# Patient Record
Sex: Male | Born: 1965 | Hispanic: No | Smoking: Current some day smoker
Health system: Southern US, Community
[De-identification: ages and names within clinical notes are randomized; demographics above are authoritative.]

## PROBLEM LIST (undated history)

## (undated) DIAGNOSIS — Z720 Tobacco use: Secondary | ICD-10-CM

## (undated) DIAGNOSIS — E785 Hyperlipidemia, unspecified: Secondary | ICD-10-CM

## (undated) DIAGNOSIS — T8859XA Other complications of anesthesia, initial encounter: Secondary | ICD-10-CM

## (undated) DIAGNOSIS — Z9289 Personal history of other medical treatment: Secondary | ICD-10-CM

## (undated) DIAGNOSIS — E559 Vitamin D deficiency, unspecified: Secondary | ICD-10-CM

## (undated) DIAGNOSIS — R079 Chest pain, unspecified: Secondary | ICD-10-CM

## (undated) DIAGNOSIS — J40 Bronchitis, not specified as acute or chronic: Secondary | ICD-10-CM

## (undated) DIAGNOSIS — R112 Nausea with vomiting, unspecified: Secondary | ICD-10-CM

## (undated) DIAGNOSIS — D860 Sarcoidosis of lung: Secondary | ICD-10-CM

## (undated) HISTORY — DX: Vitamin D deficiency, unspecified: E55.9

## (undated) HISTORY — PX: TONSILLECTOMY: SUR1361

## (undated) HISTORY — DX: Personal history of other medical treatment: Z92.89

## (undated) HISTORY — DX: Tobacco use: Z72.0

## (undated) HISTORY — DX: Hyperlipidemia, unspecified: E78.5

## (undated) HISTORY — DX: Chest pain, unspecified: R07.9

## (undated) HISTORY — DX: Nausea with vomiting, unspecified: R11.2

## (undated) HISTORY — DX: Other complications of anesthesia, initial encounter: T88.59XA

---

## 1996-02-15 DIAGNOSIS — D860 Sarcoidosis of lung: Secondary | ICD-10-CM

## 1996-02-15 HISTORY — DX: Sarcoidosis of lung: D86.0

## 1996-02-15 HISTORY — PX: LUNG BIOPSY: SHX232

## 2000-03-25 ENCOUNTER — Emergency Department (HOSPITAL_COMMUNITY): Admission: EM | Admit: 2000-03-25 | Discharge: 2000-03-25 | Payer: Self-pay | Admitting: Emergency Medicine

## 2001-06-06 ENCOUNTER — Encounter: Payer: Self-pay | Admitting: Emergency Medicine

## 2001-06-06 ENCOUNTER — Emergency Department (HOSPITAL_COMMUNITY): Admission: EM | Admit: 2001-06-06 | Discharge: 2001-06-06 | Payer: Self-pay | Admitting: Emergency Medicine

## 2002-04-25 ENCOUNTER — Emergency Department (HOSPITAL_COMMUNITY): Admission: EM | Admit: 2002-04-25 | Discharge: 2002-04-25 | Payer: Self-pay | Admitting: Emergency Medicine

## 2002-04-25 ENCOUNTER — Encounter: Payer: Self-pay | Admitting: Emergency Medicine

## 2005-01-25 ENCOUNTER — Emergency Department (HOSPITAL_COMMUNITY): Admission: EM | Admit: 2005-01-25 | Discharge: 2005-01-26 | Payer: Self-pay | Admitting: Emergency Medicine

## 2007-11-07 ENCOUNTER — Emergency Department (HOSPITAL_COMMUNITY): Admission: EM | Admit: 2007-11-07 | Discharge: 2007-11-07 | Payer: Self-pay | Admitting: *Deleted

## 2010-09-20 ENCOUNTER — Emergency Department (HOSPITAL_COMMUNITY): Payer: Worker's Compensation

## 2010-09-20 ENCOUNTER — Emergency Department (HOSPITAL_COMMUNITY)
Admission: EM | Admit: 2010-09-20 | Discharge: 2010-09-20 | Disposition: A | Discharge status: Home / Self Care | Payer: Worker's Compensation | Attending: Emergency Medicine | Admitting: Emergency Medicine

## 2010-09-20 DIAGNOSIS — S51809A Unspecified open wound of unspecified forearm, initial encounter: Secondary | ICD-10-CM | POA: Insufficient documentation

## 2010-09-20 DIAGNOSIS — D869 Sarcoidosis, unspecified: Secondary | ICD-10-CM | POA: Insufficient documentation

## 2010-09-20 DIAGNOSIS — M79609 Pain in unspecified limb: Secondary | ICD-10-CM | POA: Insufficient documentation

## 2010-09-20 DIAGNOSIS — W268XXA Contact with other sharp object(s), not elsewhere classified, initial encounter: Secondary | ICD-10-CM | POA: Insufficient documentation

## 2010-09-20 DIAGNOSIS — M7989 Other specified soft tissue disorders: Secondary | ICD-10-CM | POA: Insufficient documentation

## 2010-09-20 DIAGNOSIS — E7800 Pure hypercholesterolemia, unspecified: Secondary | ICD-10-CM | POA: Insufficient documentation

## 2010-11-15 LAB — RAPID STREP SCREEN (MED CTR MEBANE ONLY): Streptococcus, Group A Screen (Direct): NEGATIVE

## 2011-06-15 DIAGNOSIS — Z9289 Personal history of other medical treatment: Secondary | ICD-10-CM

## 2011-06-15 DIAGNOSIS — R079 Chest pain, unspecified: Secondary | ICD-10-CM

## 2011-06-15 HISTORY — DX: Chest pain, unspecified: R07.9

## 2011-06-15 HISTORY — DX: Personal history of other medical treatment: Z92.89

## 2011-07-03 ENCOUNTER — Emergency Department (HOSPITAL_COMMUNITY): Payer: BC Managed Care – PPO

## 2011-07-03 ENCOUNTER — Observation Stay (HOSPITAL_COMMUNITY)
Admission: EM | Admit: 2011-07-03 | Discharge: 2011-07-04 | Disposition: A | Discharge status: Home / Self Care | Payer: BC Managed Care – PPO | Attending: Emergency Medicine | Admitting: Emergency Medicine

## 2011-07-03 ENCOUNTER — Encounter (HOSPITAL_COMMUNITY): Payer: Self-pay | Admitting: Emergency Medicine

## 2011-07-03 DIAGNOSIS — R079 Chest pain, unspecified: Principal | ICD-10-CM | POA: Insufficient documentation

## 2011-07-03 DIAGNOSIS — R0602 Shortness of breath: Secondary | ICD-10-CM | POA: Insufficient documentation

## 2011-07-03 HISTORY — DX: Sarcoidosis of lung: D86.0

## 2011-07-03 HISTORY — DX: Bronchitis, not specified as acute or chronic: J40

## 2011-07-03 LAB — BASIC METABOLIC PANEL
CO2: 26 mEq/L (ref 19–32)
Calcium: 9.9 mg/dL (ref 8.4–10.5)
Chloride: 105 mEq/L (ref 96–112)
Glucose, Bld: 89 mg/dL (ref 70–99)
Potassium: 3.7 mEq/L (ref 3.5–5.1)
Sodium: 140 mEq/L (ref 135–145)

## 2011-07-03 LAB — TROPONIN I
Troponin I: <0.3 ng/mL (ref <0.30)
Troponin I: <0.3 ng/mL (ref <0.30)

## 2011-07-03 LAB — CBC
Hemoglobin: 16.4 g/dL (ref 13.0–17.0)
MCH: 30.4 pg (ref 26.0–34.0)
Platelets: 233 10*3/uL (ref 150–400)
RBC: 5.4 MIL/uL (ref 4.22–5.81)
WBC: 10.4 10*3/uL (ref 4.0–10.5)

## 2011-07-03 MED ORDER — ALBUTEROL SULFATE HFA 108 (90 BASE) MCG/ACT IN AERS
2.0000 | INHALATION_SPRAY | RESPIRATORY_TRACT | Status: DC
  Administered 2011-07-04: 2 via RESPIRATORY_TRACT
  Filled 2011-07-03: qty 6.7

## 2011-07-03 MED ORDER — ASPIRIN 325 MG PO TABS
325.0000 mg | ORAL_TABLET | Freq: Once | ORAL | Status: AC
  Administered 2011-07-03: 325 mg via ORAL
  Filled 2011-07-03: qty 1

## 2011-07-03 MED ORDER — GI COCKTAIL ~~LOC~~
30.0000 mL | Freq: Once | ORAL | Status: AC
  Administered 2011-07-03: 30 mL via ORAL
  Filled 2011-07-03: qty 30

## 2011-07-03 MED ORDER — NITROGLYCERIN 2 % TD OINT
1.0000 [in_us] | TOPICAL_OINTMENT | Freq: Once | TRANSDERMAL | Status: AC
  Administered 2011-07-03: 1 [in_us] via TOPICAL
  Filled 2011-07-03: qty 1

## 2011-07-03 NOTE — ED Notes (Signed)
Pt's wife left contact # [phone] Marilu Favre.  Plz call if pt is admitted. She had to leave to pick up kids

## 2011-07-03 NOTE — ED Notes (Signed)
Pt requesting albuterol inhaler for recent sinus congestion and tightness. Advised MD and received verbal order for inhaler.

## 2011-07-03 NOTE — ED Notes (Signed)
Reports pain in center of chest that radiated to R jaw last night and again around 12pm today.  Denies pain at present.  Reports mild lightheadedness.  Denies nausea, vomiting, and sob.

## 2011-07-03 NOTE — ED Provider Notes (Signed)
Subjective: complains of chest pain anterior radiating to right ear 2 episodes onset yesterday yesterday's episode lasted several hours today's episode lasted 20 minutes worse with walking improved with rest patient presently asymptomatic. Cardiac risk factors include smoker hypercholesterolemia  objective  on exam no distress lungs clear to auscultation heart regular rate and rhythm abdomen soft nontender extremities without edema skin warm dry Assessment angina remote possibility Plan CDU protocol chest pain-rule out  Doug Sou, MD 07/03/11 1608

## 2011-07-03 NOTE — ED Provider Notes (Signed)
History     CSN: [id number]  Arrival date & time 07/03/11  1459   First MD Initiated Contact with Patient 07/03/11 1531      Chief Complaint  Patient presents with  . Chest Pain    (Consider location/radiation/quality/duration/timing/severity/associated sxs/prior treatment) HPI Comments: Had chest pressure last night while sleeping that resolved without intervention.  4 hours ago had return of central chest pressure radiating to right jaw.  Pt says pain began at rest but worse with with both exertion and deep breaths.  Has not had any similar symptoms previously.  No hx of PE, DVT.  No leg swelling.  No recent immobility.  Patient is a 46 y.o. male presenting with chest pain. The history is provided by the patient.  Chest Pain The chest pain began 3 - 5 hours ago. Duration of episode(s) is 4 hours. Chest pain occurs intermittently. The chest pain is resolved. The pain is associated with breathing (worse with breathing and exertion). The severity of the pain is moderate. The quality of the pain is described as pressure-like. The pain radiates to the right jaw (central chest to right jaw). Chest pain is worsened by deep breathing and exertion. Primary symptoms include shortness of breath. Pertinent negatives for primary symptoms include no fever, no fatigue, no syncope, no cough, no wheezing, no palpitations, no abdominal pain, no nausea, no vomiting and no dizziness. He tried nothing for the symptoms.     Past Medical History  Diagnosis Date  . Pulmonary sarcoidosis   . Bronchitis     Past Surgical History  Procedure Date  . Tonsillectomy     No family history on file.  History  Substance Use Topics  . Smoking status: Current Everyday Smoker  . Smokeless tobacco: Not on file  . Alcohol Use: No      Review of Systems  Constitutional: Negative for fever, activity change and fatigue.  HENT: Negative for congestion.   Eyes: Negative for pain.  Respiratory: Positive for  shortness of breath. Negative for cough, chest tightness, wheezing and stridor.   Cardiovascular: Positive for chest pain. Negative for palpitations, leg swelling and syncope.  Gastrointestinal: Negative for nausea, vomiting and abdominal pain.  Genitourinary: Negative for dysuria.  Musculoskeletal: Negative for arthralgias.  Skin: Negative for rash.  Neurological: Negative for dizziness and headaches.  Psychiatric/Behavioral: Negative for behavioral problems.    Allergies  Tramadol  Home Medications   Current Outpatient Rx  Name Route Sig Dispense Refill  . BISMUTH SUBSALICYLATE 262 MG/15ML PO SUSP Oral Take 30 mLs by mouth every 6 (six) hours as needed. For indigestion    . CALCIUM CARBONATE ANTACID 500 MG PO CHEW Oral Chew 4 tablets by mouth daily as needed. For indigestion    . IBUPROFEN 200 MG PO TABS Oral Take 600 mg by mouth every 6 (six) hours as needed. For pain    . NAPROXEN SODIUM 220 MG PO TABS Oral Take 220 mg by mouth 2 (two) times daily with a meal.      BP 119/80  Pulse 51  Temp(Src) 98.6 F (37 C) (Oral)  Resp 18  SpO2 100%  Physical Exam  Constitutional: He is oriented to person, place, and time. He appears well-developed and well-nourished. No distress.  HENT:  Head: Normocephalic and atraumatic.  Eyes: Conjunctivae and EOM are normal. Pupils are equal, round, and reactive to light. No scleral icterus.  Neck: Normal range of motion. Neck supple.  Cardiovascular: Normal rate and regular rhythm.  Exam  reveals no gallop and no friction rub.   No murmur heard. Pulmonary/Chest: Effort normal and breath sounds normal. No respiratory distress. He has no wheezes. He has no rales. He exhibits no tenderness.  Abdominal: Soft. He exhibits no distension and no mass. There is no tenderness. There is no rebound and no guarding.  Musculoskeletal: Normal range of motion. He exhibits no edema and no tenderness.  Neurological: He is alert and oriented to person, place, and  time. He has normal reflexes. No cranial nerve deficit. He exhibits normal muscle tone. Coordination normal.  Skin: Skin is warm and dry. No rash noted. He is not diaphoretic. No erythema.  Psychiatric: He has a normal mood and affect. His behavior is normal. Judgment and thought content normal.    ED Course  Procedures (including critical care time)   Date: 07/03/2011  Rate: 59  Rhythm: normal sinus rhythm  QRS Axis: normal  Intervals: normal  ST/T Wave abnormalities: early repol c/w prior  Conduction Disutrbances: none  Narrative Interpretation:   Old EKG Reviewed: unchanged    Labs Reviewed  CBC - Abnormal; Notable for the following:    MCHC 36.4 (*)    All other components within normal limits  BASIC METABOLIC PANEL  TROPONIN I  TROPONIN I  TROPONIN I  TROPONIN I   Dg Chest Port 1 View  07/03/2011  *RADIOLOGY REPORT*  Clinical Data: Chest pain.  PORTABLE CHEST - 1 VIEW  Comparison: No priors.  Findings: Right costophrenic sulcus is excluded from the lower margin of the image.  Lung volumes are normal.  No consolidative airspace disease.  No definite pleural effusions.  No pneumothorax. No pulmonary nodule or mass noted.  Pulmonary vasculature and the cardiomediastinal silhouette are within normal limits.  IMPRESSION: 1. No radiographic evidence of acute cardiopulmonary disease.  Original Report Authenticated By: Florencia Reasons, M.D.     1. Chest pain       MDM  Had chest pressure last night while sleeping that resolved without intervention.  4 hours ago had return of central chest pressure radiating to right jaw.  Pt says pain began at rest but worse with with both exertion and deep breaths.  Has not had any similar symptoms previously.  No hx of PE, DVT.  No leg swelling.  No recent immobility.  Atypical presentation and PERC negative - doubt PE.  EKG, labs unconcerning.  Chest pain free throughout ED stay.  Some suspicion for ACS.  Will rule out and exercise stress  in low risk chest pain obs.  Pt comfortable with plan.        Army Chaco, MD 07/03/11 1810

## 2011-07-04 ENCOUNTER — Observation Stay (HOSPITAL_COMMUNITY)
Admit: 2011-07-04 | Discharge: 2011-07-04 | Disposition: A | Discharge status: Home / Self Care | Payer: BC Managed Care – PPO | Attending: Emergency Medicine | Admitting: Emergency Medicine

## 2011-07-04 MED ORDER — NITROGLYCERIN 0.4 MG SL SUBL
0.4000 mg | SUBLINGUAL_TABLET | Freq: Once | SUBLINGUAL | Status: AC
  Administered 2011-07-04: 0.4 mg via SUBLINGUAL

## 2011-07-04 MED ORDER — METOPROLOL TARTRATE 1 MG/ML IV SOLN
INTRAVENOUS | Status: AC
  Filled 2011-07-04: qty 15

## 2011-07-04 MED ORDER — IOHEXOL 350 MG/ML SOLN
80.0000 mL | Freq: Once | INTRAVENOUS | Status: AC | PRN
  Administered 2011-07-04: 80 mL via INTRAVENOUS

## 2011-07-04 MED ORDER — NITROGLYCERIN 0.4 MG SL SUBL
SUBLINGUAL_TABLET | SUBLINGUAL | Status: AC
  Filled 2011-07-04: qty 25

## 2011-07-04 MED ORDER — METOPROLOL TARTRATE 25 MG PO TABS
50.0000 mg | ORAL_TABLET | Freq: Once | ORAL | Status: AC
  Administered 2011-07-04: 50 mg via ORAL
  Filled 2011-07-04: qty 2

## 2011-07-04 NOTE — ED Provider Notes (Signed)
I have personally seen and examined the patient.  I have discussed the plan of care with the resident.  I have reviewed the documentation on PMH/FH/Soc. History.  I have reviewed the documentation of the resident and agree.  Doug Sou, MD 07/04/11 [phone]

## 2011-07-04 NOTE — ED Provider Notes (Signed)
Medical screening examination/treatment/procedure(s) were performed by non-physician practitioner and as supervising physician I was immediately available for consultation/collaboration.   Dione Booze, MD 07/04/11 [phone]

## 2011-07-04 NOTE — ED Provider Notes (Signed)
10:45 - CT results negative as per Dr. Kearney Hard. No pain throughout duration of ED visit. Will discharge home to follow up with PCP.   Rodena Medin, PA-C 07/04/11 1047

## 2011-07-04 NOTE — Discharge Instructions (Signed)
YOUR EVALUATION FOR CARDIAC DISEASE IS NEGATIVE. YOU CAN BE DISCHARGED HOME AND SHOULD FOLLOW UP WITH YOUR DOCTOR AS NEEDED FOR ANY RECURRENT SYMPTOMS. RETURN HERE AS NEEDED

## 2012-10-03 ENCOUNTER — Encounter: Payer: Self-pay | Admitting: Internal Medicine

## 2012-10-10 ENCOUNTER — Ambulatory Visit (INDEPENDENT_AMBULATORY_CARE_PROVIDER_SITE_OTHER): Payer: BC Managed Care – PPO | Admitting: Medical

## 2012-10-10 ENCOUNTER — Encounter: Payer: Self-pay | Admitting: Medical

## 2012-10-10 VITALS — BP 100/80 | HR 74 | Temp 98.4°F | Resp 16 | Ht 71.0 in | Wt 166.0 lb

## 2012-10-10 DIAGNOSIS — D869 Sarcoidosis, unspecified: Secondary | ICD-10-CM

## 2012-10-10 DIAGNOSIS — E785 Hyperlipidemia, unspecified: Secondary | ICD-10-CM

## 2012-10-10 DIAGNOSIS — J99 Respiratory disorders in diseases classified elsewhere: Secondary | ICD-10-CM

## 2012-10-10 DIAGNOSIS — Z0000 Encounter for general adult medical examination without abnormal findings: Secondary | ICD-10-CM

## 2012-10-10 DIAGNOSIS — Z23 Encounter for immunization: Secondary | ICD-10-CM

## 2012-10-10 DIAGNOSIS — D860 Sarcoidosis of lung: Secondary | ICD-10-CM

## 2012-10-10 DIAGNOSIS — F17200 Nicotine dependence, unspecified, uncomplicated: Secondary | ICD-10-CM

## 2012-10-10 LAB — POCT URINALYSIS DIPSTICK
Bilirubin, UA: NEGATIVE
Ketones, UA: NEGATIVE
Leukocytes, UA: NEGATIVE
Spec Grav, UA: <=1.005

## 2012-10-10 LAB — COMPREHENSIVE METABOLIC PANEL
ALT: 17 U/L (ref 0–53)
Albumin: 4.6 g/dL (ref 3.5–5.2)
CO2: 27 mEq/L (ref 19–32)
Calcium: 10.3 mg/dL (ref 8.4–10.5)
Chloride: 102 mEq/L (ref 96–112)
Glucose, Bld: 89 mg/dL (ref 70–99)
Potassium: 4.4 mEq/L (ref 3.5–5.3)
Sodium: 139 mEq/L (ref 135–145)
Total Bilirubin: 0.6 mg/dL (ref 0.3–1.2)
Total Protein: 7.2 g/dL (ref 6.0–8.3)

## 2012-10-10 LAB — CBC WITH DIFFERENTIAL/PLATELET
Basophils Absolute: 0 10*3/uL (ref 0.0–0.1)
Eosinophils Relative: 3 % (ref 0–5)
Lymphocytes Relative: 25 % (ref 12–46)
MCV: 85.2 fL (ref 78.0–100.0)
Neutrophils Relative %: 64 % (ref 43–77)
Platelets: 278 10*3/uL (ref 150–400)
RBC: 5.62 MIL/uL (ref 4.22–5.81)
RDW: 13.5 % (ref 11.5–15.5)
WBC: 10.5 10*3/uL (ref 4.0–10.5)

## 2012-10-10 LAB — LIPID PANEL
Cholesterol: 253 mg/dL — ABNORMAL HIGH (ref 0–200)
HDL: 26 mg/dL — ABNORMAL LOW (ref >39)

## 2012-10-10 MED ORDER — VARENICLINE TARTRATE 1 MG PO TABS: 1.0000 mg | ORAL_TABLET | Freq: Two times a day (BID) | ORAL | Status: DC

## 2012-10-10 NOTE — Addendum Note (Signed)
Addended by: Janeice Robinson on: 10/10/2012 09:47 AM   Modules accepted: Orders

## 2012-10-10 NOTE — Patient Instructions (Signed)
YOU CAN QUIT SMOKING!  Talk to your medical provider about using medicines to help you quit. These include nicotine replacement gum, lozenges, or skin patches.  Consider calling 1-800-QUIT-NOW, a toll free 24/7 hotline with free counseling to help you quit.  If you are ready to quit smoking or are thinking about it, congratulations! You have chosen to help yourself be healthier and live longer! There are lots of different ways to quit smoking. Nicotine gum, nicotine patches, a nicotine inhaler, or nicotine nasal spray can help with physical craving. Hypnosis, support groups, and medicines help break the habit of smoking. TIPS TO GET OFF AND STAY OFF CIGARETTES  Learn to predict your moods. Do not let a bad situation be your excuse to have a cigarette. Some situations in your life might tempt you to have a cigarette.   Ask friends and co-workers not to smoke around you.   Make your home smoke-free.   Never have "just one" cigarette. It leads to wanting another and another. Remind yourself of your decision to quit.   On a card, make a list of your reasons for not smoking. Read it at least the same number of times a day as you have a cigarette. Tell yourself everyday, "I do not want to smoke. I choose not to smoke."   Ask someone at home or work to help you with your plan to quit smoking.   Have something planned after you eat or have a cup of coffee. Take a walk or get other exercise to perk you up. This will help to keep you from overeating.   Try a relaxation exercise to calm you down and decrease your stress. Remember, you may be tense and nervous the first two weeks after you quit. This will pass.   Find new activities to keep your hands busy. Play with a pen, coin, or rubber band. Doodle or draw things on paper.   Brush your teeth right after eating. This will help cut down the craving for the taste of tobacco after meals. You can try mouthwash too.   Try gum, breath mints, or diet  candy to keep something in your mouth.  IF YOU SMOKE AND WANT TO QUIT:  Do not stock up on cigarettes. Never buy a carton. Wait until one pack is finished before you buy another.   Never carry cigarettes with you at work or at home.   Keep cigarettes as far away from you as possible. Leave them with someone else.   Never carry matches or a lighter with you.   Ask yourself, "Do I need this cigarette or is this just a reflex?"   Bet with someone that you can quit. Put cigarette money in a piggy bank every morning. If you smoke, you give up the money. If you do not smoke, by the end of the week, you keep the money.   Keep trying. It takes 21 days to change a habit!  Document Released: 11/27/2008 Document Revised: 10/13/2010 Document Reviewed: 11/27/2008 [facility] Patient Information 2012 [place], [place].  Preventative Care for Adults, Male       REGULAR HEALTH EXAMS:  A routine yearly physical is a good way to check in with your primary care provider about your health and preventive screening. It is also an opportunity to share updates about your health and any concerns you have, and receive a thorough all-over exam.   Most health insurance companies pay for at least some preventative services.  Check with your  health plan for specific coverages.  WHAT PREVENTATIVE SERVICES DO MEN NEED?  Adult men should have their weight and blood pressure checked regularly.   Men age 74 and older should have their cholesterol levels checked regularly.  Beginning at age 21 and continuing to age 61, men should be screened for colorectal cancer.  Certain people should may need continued testing until age 40.  Other cancer screening may include exams for testicular and prostate cancer.  Updating vaccinations is part of preventative care.  Vaccinations help protect against diseases such as the flu.  Lab tests are generally done as part of preventative care to screen for anemia and blood disorders, to  screen for problems with the kidneys and liver, to screen for bladder problems, to check blood sugar, and to check your cholesterol level.  Preventative services generally include counseling about diet, exercise, avoiding tobacco, drugs, excessive alcohol consumption, and sexually transmitted infections.    GENERAL RECOMMENDATIONS FOR GOOD HEALTH:  Healthy diet:  Eat a variety of foods, including fruit, vegetables, animal or vegetable protein, such as meat, fish, chicken, and eggs, or beans, lentils, tofu, and grains, such as rice.  Drink plenty of water daily.  Decrease saturated fat in the diet, avoid lots of red meat, processed foods, sweets, fast foods, and fried foods.  Exercise:  Aerobic exercise helps maintain good heart health. At least 30-40 minutes of moderate-intensity exercise is recommended. For example, a brisk walk that increases your heart rate and breathing. This should be done on most days of the week.   Find a type of exercise or a variety of exercises that you enjoy so that it becomes a part of your daily life.  Examples are running, walking, swimming, water aerobics, and biking.  For motivation and support, explore group exercise such as aerobic class, spin class, Zumba, Yoga,or  martial arts, etc.    Set exercise goals for yourself, such as a certain weight goal, walk or run in a race such as a 5k walk/run.  Speak to your primary care provider about exercise goals.  Disease prevention:  If you smoke or chew tobacco, find out from your caregiver how to quit. It can literally save your life, no matter how long you have been a tobacco user. If you do not use tobacco, never begin.   Maintain a healthy diet and normal weight. Increased weight leads to problems with blood pressure and diabetes.   The Body Mass Index or BMI is a way of measuring how much of your body is fat. Having a BMI above 27 increases the risk of heart disease, diabetes, hypertension, stroke and other  problems related to obesity. Your caregiver can help determine your BMI and based on it develop an exercise and dietary program to help you achieve or maintain this important measurement at a healthful level.  High blood pressure causes heart and blood vessel problems.  Persistent high blood pressure should be treated with medicine if weight loss and exercise do not work.   Fat and cholesterol leaves deposits in your arteries that can block them. This causes heart disease and vessel disease elsewhere in your body.  If your cholesterol is found to be high, or if you have heart disease or certain other medical conditions, then you may need to have your cholesterol monitored frequently and be treated with medication.   Ask if you should have a stress test if your history suggests this. A stress test is a test done on a treadmill  that looks for heart disease. This test can find disease prior to there being a problem.  Avoid drinking alcohol in excess (more than two drinks per day).  Avoid use of street drugs. Do not share needles with anyone. Ask for professional help if you need assistance or instructions on stopping the use of alcohol, cigarettes, and/or drugs.  Brush your teeth twice a day with fluoride toothpaste, and floss once a day. Good oral hygiene prevents tooth decay and gum disease. The problems can be painful, unattractive, and can cause other health problems. Visit your dentist for a routine oral and dental check up and preventive care every 6-12 months.   Look at your skin regularly.  Use a mirror to look at your back. Notify your caregivers of changes in moles, especially if there are changes in shapes, colors, a size larger than a pencil eraser, an irregular border, or development of new moles.  Safety:  Use seatbelts 100% of the time, whether driving or as a passenger.  Use safety devices such as hearing protection if you work in environments with loud noise or significant background  noise.  Use safety glasses when doing any work that could send debris in to the eyes.  Use a helmet if you ride a bike or motorcycle.  Use appropriate safety gear for contact sports.  Talk to your caregiver about gun safety.  Use sunscreen with a SPF (or skin protection factor) of 15 or greater.  Lighter skinned people are at a greater risk of skin cancer. Don't forget to also wear sunglasses in order to protect your eyes from too much damaging sunlight. Damaging sunlight can accelerate cataract formation.   Practice safe sex. Use condoms. Condoms are used for birth control and to help reduce the spread of sexually transmitted infections (or STIs).  Some of the STIs are gonorrhea (the clap), chlamydia, syphilis, trichomonas, herpes, HPV (human papilloma virus) and HIV (human immunodeficiency virus) which causes AIDS. The herpes, HIV and HPV are viral illnesses that have no cure. These can result in disability, cancer and death.   Keep carbon monoxide and smoke detectors in your home functioning at all times. Change the batteries every 6 months or use a model that plugs into the wall.   Vaccinations:  Stay up to date with your tetanus shots and other required immunizations. You should have a booster for tetanus every 10 years. Be sure to get your flu shot every year, since 5%-20% of the [place] population comes down with the flu. The flu vaccine changes each year, so being vaccinated once is not enough. Get your shot in the fall, before the flu season peaks.   Other vaccines to consider:  Pneumococcal vaccine to protect against certain types of pneumonia.  This is normally recommended for adults age 89 or older.  However, adults younger than 45 years old with certain underlying conditions such as diabetes, heart or lung disease should also receive the vaccine.  Shingles vaccine to protect against Varicella Zoster if you are older than age 20, or younger than 45 years old with certain underlying  illness.  Hepatitis A vaccine to protect against a form of infection of the liver by a virus acquired from food.  Hepatitis B vaccine to protect against a form of infection of the liver by a virus acquired from blood or body fluids, particularly if you work in health care.  If you plan to travel internationally, check with your local health department for specific vaccination recommendations.  Cancer Screening:  Most routine colon cancer screening begins at the age of 37. On a yearly basis, doctors may provide special easy to use take-home tests to check for hidden blood in the stool. Sigmoidoscopy or colonoscopy can detect the earliest forms of colon cancer and is life saving. These tests use a small camera at the end of a tube to directly examine the colon. Speak to your caregiver about this at age 35, when routine screening begins (and is repeated every 5 years unless early forms of pre-cancerous polyps or small growths are found).   At the age of 41 men usually start screening for prostate cancer every year. Screening may begin at a younger age for those with higher risk. Those at higher risk include African-Americans or having a family history of prostate cancer. There are two types of tests for prostate cancer:   Prostate-specific antigen (PSA) testing. Recent studies raise questions about prostate cancer using PSA and you should discuss this with your caregiver.   Digital rectal exam (in which your doctor's lubricated and gloved finger feels for enlargement of the prostate through the anus).   Screening for testicular cancer.  Do a monthly exam of your testicles. Gently roll each testicle between your thumb and fingers, feeling for any abnormal lumps. The best time to do this is after a hot shower or bath when the tissues are looser. Notify your caregivers of any lumps, tenderness or changes in size or shape immediately.

## 2012-10-10 NOTE — Progress Notes (Signed)
Subjective:   HPI  Jackson Arnold is a 47 y.o. male who presents for a complete physical.  New patient today.   Was seeing [facility] prior.   Preventative care: Last ophthalmology visit:n/a Last dental visit:yes-  Dr. Tommye Standard Last colonoscopy:remote past Last prostate exam: yes Last EKG yes- 2013:- [facility] Last labs:n/a  Prior vaccinations: TD or Tdap:2013 Influenza:never- declined flu vaccine Pneumococcal:n/a Shingles/Zostavax:n/a   Advanced directive:n/a Health care power of attorney:n/a Living will:n/a  Concerns: Worries about his lungs.  Gets cough spells that feel like its in his upper airway, not his chest.  Gets up a lot of mucous at times.  He is a smoker, has hx/o pulmonary sarcoidosis.    Reviewed their medical, surgical, family, social, medication, and allergy history and updated chart as appropriate.  Past Medical History  Diagnosis Date  . Pulmonary sarcoidosis 1998  . Bronchitis   . Hyperlipidemia     tricor prior  . Tobacco use   . Chest pain 06/2011    ED/observation visit  . History of EKG 5/13    sinus bradycardia  . H/O CT scan 06/2011    cardiac, normal    Past Surgical History  Procedure Laterality Date  . Tonsillectomy    . Lung biopsy  1998  . Colonoscopy      remote past, [place]    History   Social History  . Marital Status: Married    Spouse Name: N/A    Number of Children: N/A  . Years of Education: N/A   Occupational History  . Not on file.   Social History Main Topics  . Smoking status: Current Every Day Smoker -- 1.00 packs/day for 27 years  . Smokeless tobacco: Not on file  . Alcohol Use: No  . Drug Use: No  . Sexual Activity: Not on file   Other Topics Concern  . 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 Diabetes Mother   . Cancer Mother      skin  . Cancer Maternal Aunt     colon  . Stroke Paternal Grandmother   . Heart disease Neg Hx   . Hypertension Neg Hx     Current outpatient prescriptions:albuterol (PROVENTIL HFA;VENTOLIN HFA) 108 (90 BASE) MCG/ACT inhaler, Inhale 2 puffs into the lungs every 6 (six) hours as needed for wheezing., Disp: , Rfl: ;  beclomethasone (QVAR) 80 MCG/ACT inhaler, Inhale 1 puff into the lungs as needed., Disp: , Rfl: ;  bismuth subsalicylate (PEPTO BISMOL) 262 MG/15ML suspension, Take 30 mLs by mouth every 6 (six) hours as needed. For indigestion, Disp: , Rfl:  calcium carbonate (TUMS) 500 MG chewable tablet, Chew 4 tablets by mouth daily as needed. For indigestion, Disp: , Rfl: ;  ibuprofen (ADVIL,MOTRIN) 200 MG tablet, Take 600 mg by mouth every 6 (six) hours as needed. For pain, Disp: , Rfl: ;  naproxen sodium (ANAPROX) 220 MG tablet, Take 220 mg by mouth 2 (two) times daily with a meal., Disp: , Rfl:   Allergies  Allergen Reactions  . Tramadol Nausea And Vomiting       Review of Systems Constitutional: -fever, -chills, -sweats, -unexpected weight change, -decreased appetite, -fatigue Allergy: -sneezing, -itching, -congestion Dermatology: -changing moles, --rash, -lumps ENT: -runny nose, -ear pain, -sore throat, -hoarseness, -sinus pain, -teeth pain, -  ringing in ears, -hearing loss, -nosebleeds Cardiology: -chest pain, -palpitations, -swelling, -difficulty breathing when lying flat, -waking up short of breath Respiratory: -cough, -shortness of breath, -difficulty breathing with exercise or exertion, -wheezing, -coughing up blood Gastroenterology: -abdominal pain, -nausea, -vomiting, -diarrhea, -constipation, -blood in stool, -changes in bowel movement, -difficulty swallowing or eating Hematology: -bleeding, -bruising  Musculoskeletal: -joint aches, -muscle aches, -joint swelling, -back pain, -neck pain, -cramping, -changes in gait Ophthalmology: denies vision changes, eye redness,  itching, discharge Urology: -burning with urination, -difficulty urinating, -blood in urine, -urinary frequency, -urgency, -incontinence Neurology: -headache, -weakness, -tingling, -numbness, -memory loss, -falls, -dizziness Psychology: -depressed mood, -agitation, -sleep problems     Objective:   Physical Exam  Filed Vitals:   10/10/12 0831  BP: 100/80  Pulse: 74  Temp: 98.4 F (36.9 C)  Resp: 16    General appearance: alert, no distress, WD/WN, lean male Skin:few scattered benign appearing lesions HEENT: normocephalic, conjunctiva/corneas normal, sclerae anicteric, PERRLA, EOMi, nares patent, no discharge or erythema, pharynx normal Oral cavity: MMM, tongue normal, teeth in good repair Neck: supple, no lymphadenopathy, no thyromegaly, no masses, normal ROM, no bruits Chest: non tender, normal shape and expansion Heart: RRR, normal S1, S2, no murmurs Lungs: CTA bilaterally, no wheezes, rhonchi, or rales Abdomen: +bs, soft, non tender, non distended, no masses, no hepatomegaly, no splenomegaly, no bruits Back: non tender, normal ROM, no scoliosis Musculoskeletal: upper extremities non tender, no obvious deformity, normal ROM throughout, lower extremities non tender, no obvious deformity, normal ROM throughout Extremities: no edema, no cyanosis, no clubbing Pulses: 2+ symmetric, upper and lower extremities, normal cap refill Neurological: alert, oriented x 3, CN2-12 intact, strength normal upper extremities and lower extremities, sensation normal throughout, DTRs 2+ throughout, no cerebellar signs, gait normal Psychiatric: normal affect, behavior normal, pleasant  GU: normal male external genitalia, circumcised, nontender, no masses, no hernia, no lymphadenopathy Rectal: deferred   Assessment and Plan :      Encounter Diagnoses  Name Primary?  . Routine general medical examination at a health care facility Yes  . Hyperlipidemia   . Pulmonary sarcoidosis   . Tobacco use  disorder   . Need for prophylactic vaccination against Streptococcus pneumoniae (pneumococcus)     Physical exam - discussed healthy lifestyle, diet, exercise, preventative care, vaccinations, and addressed their concerns.  Advised eye and dental visits yearly.  Declines flu vaccine. hyperlipidemia - labs today fasting Pulmonary sarcoidosis - advised smoking cessation, and he will get me copy of his PFT s from work which are done yearly. Tobacco use - he is agreeable to cessation.  Prior attempts with Wellbutrin only.  Begin counseling and trial of Chantix.  Samples and script given.   Discussed risks/benfits of medication.  F/u 42mo. Pneumococcal vaccine, VIS and counseling given Follow-up pending labs.

## 2012-10-11 ENCOUNTER — Other Ambulatory Visit: Payer: Self-pay | Admitting: Medical

## 2012-10-11 LAB — TSH: TSH: 1.627 u[IU]/mL (ref 0.350–4.500)

## 2012-10-11 MED ORDER — OMEGA-3-ACID ETHYL ESTERS 1 G PO CAPS: 2.0000 g | ORAL_CAPSULE | Freq: Two times a day (BID) | ORAL | Status: DC

## 2012-10-12 NOTE — Progress Notes (Signed)
Tried to call the patient on his cell phone no answer nor voicemail. CLS

## 2013-03-15 ENCOUNTER — Emergency Department (HOSPITAL_COMMUNITY)
Admission: EM | Admit: 2013-03-15 | Discharge: 2013-03-15 | Disposition: A | Discharge status: Home / Self Care | Payer: BC Managed Care – PPO | Attending: Emergency Medicine | Admitting: Emergency Medicine

## 2013-03-15 ENCOUNTER — Encounter (HOSPITAL_COMMUNITY): Payer: Self-pay | Admitting: Emergency Medicine

## 2013-03-15 DIAGNOSIS — Z8639 Personal history of other endocrine, nutritional and metabolic disease: Secondary | ICD-10-CM | POA: Insufficient documentation

## 2013-03-15 DIAGNOSIS — Z791 Long term (current) use of non-steroidal anti-inflammatories (NSAID): Secondary | ICD-10-CM | POA: Insufficient documentation

## 2013-03-15 DIAGNOSIS — J029 Acute pharyngitis, unspecified: Secondary | ICD-10-CM | POA: Insufficient documentation

## 2013-03-15 DIAGNOSIS — Z8619 Personal history of other infectious and parasitic diseases: Secondary | ICD-10-CM | POA: Insufficient documentation

## 2013-03-15 DIAGNOSIS — Z9089 Acquired absence of other organs: Secondary | ICD-10-CM | POA: Insufficient documentation

## 2013-03-15 DIAGNOSIS — F17200 Nicotine dependence, unspecified, uncomplicated: Secondary | ICD-10-CM | POA: Insufficient documentation

## 2013-03-15 DIAGNOSIS — Z862 Personal history of diseases of the blood and blood-forming organs and certain disorders involving the immune mechanism: Secondary | ICD-10-CM | POA: Insufficient documentation

## 2013-03-15 DIAGNOSIS — Z79899 Other long term (current) drug therapy: Secondary | ICD-10-CM | POA: Insufficient documentation

## 2013-03-15 LAB — RAPID STREP SCREEN (MED CTR MEBANE ONLY): Streptococcus, Group A Screen (Direct): NEGATIVE

## 2013-03-15 MED ORDER — DEXAMETHASONE SODIUM PHOSPHATE 10 MG/ML IJ SOLN
10.0000 mg | Freq: Once | INTRAMUSCULAR | Status: AC
Start: 2013-03-15 — End: 2013-03-15
  Administered 2013-03-15: 10 mg via INTRAMUSCULAR
  Filled 2013-03-15: qty 1

## 2013-03-15 MED ORDER — HYDROCODONE-ACETAMINOPHEN 7.5-325 MG/15ML PO SOLN: 15.0000 mL | Freq: Four times a day (QID) | ORAL | Status: AC | PRN

## 2013-03-15 NOTE — ED Notes (Signed)
Rt ear pain for  3 days.  Maybe a temp 2 days ago none now

## 2013-03-15 NOTE — Discharge Instructions (Signed)
Antibiotic Nonuse  Your caregiver felt that the infection or problem was not one that would be helped with an antibiotic. Infections may be caused by viruses or bacteria. Only a caregiver can tell which one of these is the likely cause of an illness. A cold is the most common cause of infection in both adults and children. A cold is a virus. Antibiotic treatment will have no effect on a viral infection. Viruses can lead to many lost days of work caring for sick children and many missed days of school. Children may catch as many as 10 "colds" or "flus" per year during which they can be tearful, cranky, and uncomfortable. The goal of treating a virus is aimed at keeping the ill person comfortable. Antibiotics are medications used to help the body fight bacterial infections. There are relatively few types of bacteria that cause infections but there are hundreds of viruses. While both viruses and bacteria cause infection they are very different types of germs. A viral infection will typically go away by itself within 7 to 10 days. Bacterial infections may spread or get worse without antibiotic treatment. Examples of bacterial infections are:  Sore throats (like strep throat or tonsillitis).  Infection in the lung (pneumonia).  Ear and skin infections. Examples of viral infections are:  Colds or flus.  Most coughs and bronchitis.  Sore throats not caused by Strep.  Runny noses. It is often best not to take an antibiotic when a viral infection is the cause of the problem. Antibiotics can kill off the helpful bacteria that we have inside our body and allow harmful bacteria to start growing. Antibiotics can cause side effects such as allergies, nausea, and diarrhea without helping to improve the symptoms of the viral infection. Additionally, repeated uses of antibiotics can cause bacteria inside of our body to become resistant. That resistance can be passed onto harmful bacterial. The next time you have  an infection it may be harder to treat if antibiotics are used when they are not needed. Not treating with antibiotics allows our own immune system to develop and take care of infections more efficiently. Also, antibiotics will work better for [place] when they are prescribed for bacterial infections. Treatments for a child that is ill may include:  Give extra fluids throughout the day to stay hydrated.  Get plenty of rest.  Only give your child over-the-counter or prescription medicines for pain, discomfort, or fever as directed by your caregiver.  The use of a cool mist humidifier may help stuffy noses.  Cold medications if suggested by your caregiver. Your caregiver may decide to start you on an antibiotic if:  The problem you were seen for today continues for a longer length of time than expected.  You develop a secondary bacterial infection. SEEK MEDICAL CARE IF:  Fever lasts longer than 5 days.  Symptoms continue to get worse after 5 to 7 days or become severe.  Difficulty in breathing develops.  Signs of dehydration develop (poor drinking, rare urinating, dark colored urine).  Changes in behavior or worsening tiredness (listlessness or lethargy). Document Released: 04/11/2001 Document Revised: 04/25/2011 Document Reviewed: 10/08/2008 [facility] Patient Information 2014 [place], [place].  Pharyngitis Pharyngitis is redness, pain, and swelling (inflammation) of your pharynx.  CAUSES  Pharyngitis is usually caused by infection. Most of the time, these infections are from viruses (viral) and are part of a cold. However, sometimes pharyngitis is caused by bacteria (bacterial). Pharyngitis can also be caused by allergies. Viral pharyngitis may be  spread from person to person by coughing, sneezing, and personal items or utensils (cups, forks, spoons, toothbrushes). Bacterial pharyngitis may be spread from person to person by more intimate contact, such as kissing.  SIGNS AND SYMPTOMS    Symptoms of pharyngitis include:   Sore throat.   Tiredness (fatigue).   Low-grade fever.   Headache.  Joint pain and muscle aches.  Skin rashes.  Swollen lymph nodes.  Plaque-like film on throat or tonsils (often seen with bacterial pharyngitis). DIAGNOSIS  Your health care provider will ask you questions about your illness and your symptoms. Your medical history, along with a physical exam, is often all that is needed to diagnose pharyngitis. Sometimes, a rapid strep test is done. Other lab tests may also be done, depending on the suspected cause.  TREATMENT  Viral pharyngitis will usually get better in 3 4 days without the use of medicine. Bacterial pharyngitis is treated with medicines that kill germs (antibiotics).  HOME CARE INSTRUCTIONS   Drink enough water and fluids to keep your urine clear or pale yellow.   Only take over-the-counter or prescription medicines as directed by your health care provider:   If you are prescribed antibiotics, make sure you finish them even if you start to feel better.   Do not take aspirin.   Get lots of rest.   Gargle with 8 oz of salt water ( tsp of salt per 1 qt of water) as often as every 1 2 hours to soothe your throat.   Throat lozenges (if you are not at risk for choking) or sprays may be used to soothe your throat. SEEK MEDICAL CARE IF:   You have large, tender lumps in your neck.  You have a rash.  You cough up green, yellow-brown, or bloody spit. SEEK IMMEDIATE MEDICAL CARE IF:   Your neck becomes stiff.  You drool or are unable to swallow liquids.  You vomit or are unable to keep medicines or liquids down.  You have severe pain that does not go away with the use of recommended medicines.  You have trouble breathing (not caused by a stuffy nose). MAKE SURE YOU:   Understand these instructions.  Will watch your condition.  Will get help right away if you are not doing well or get worse. Document  Released: 01/31/2005 Document Revised: 11/21/2012 Document Reviewed: 10/08/2012 [facility] Patient Information 2014 [place].

## 2013-03-15 NOTE — ED Provider Notes (Signed)
CSN: [id number]     Arrival date & time 03/15/13  1532 History  This chart was scribed for non-physician practitioner, Idalia Needle. Joelyn Oms, PA-C working with [employer]. Alvino Chapel, MD by Frederich Balding, ED scribe. This patient was seen in room TR07C/TR07C and the patient's care was started at 3:49 PM.    Chief Complaint  Patient presents with  . Ear Problem   The history is provided by the patient. No language interpreter was used.   HPI Comments: Jackson Arnold is a 48 y.o. male who presents to the Emergency Department complaining of gradual onset, constant right ear pain that started 3 days ago. He has tried to flush his ear out with no relief. The pain has started to radiate into his neck and throat. Pt has also had nasal congestion and nonproductive cough. Denies fever, chills, ear discharge, SOB, chest pain, nausea, emesis.  Past Medical History  Diagnosis Date  . Pulmonary sarcoidosis 1998  . Bronchitis   . Hyperlipidemia     tricor prior  . Tobacco use   . Chest pain 06/2011    ED/observation visit  . History of EKG 5/13    sinus bradycardia  . H/O CT scan 06/2011    cardiac, normal   Past Surgical History  Procedure Laterality Date  . Tonsillectomy    . Lung biopsy  1998  . Colonoscopy      remote past, [place]   Family History  Problem Relation Age of Onset  . Diabetes Mother   . Cancer Mother     skin  . Cancer Maternal Aunt     colon  . Stroke Paternal Grandmother   . Heart disease Neg Hx   . Hypertension Neg Hx    History  Substance Use Topics  . Smoking status: Current Every Day Smoker -- 1.00 packs/day for 27 years  . Smokeless tobacco: Not on file  . Alcohol Use: No    Review of Systems  Constitutional: Negative for fever and chills.  HENT: Positive for congestion, ear pain and sore throat. Negative for ear discharge.   Respiratory: Positive for cough. Negative for shortness of breath.   Cardiovascular: Negative for chest pain.  Gastrointestinal:  Negative for nausea and vomiting.  All other systems reviewed and are negative.   Allergies  Tramadol  Home Medications   Current Outpatient Rx  Name  Route  Sig  Dispense  Refill  . albuterol (PROVENTIL HFA;VENTOLIN HFA) 108 (90 BASE) MCG/ACT inhaler   Inhalation   Inhale 2 puffs into the lungs every 6 (six) hours as needed for wheezing.         . beclomethasone (QVAR) 80 MCG/ACT inhaler   Inhalation   Inhale 1 puff into the lungs as needed.         . bismuth subsalicylate (PEPTO BISMOL) 262 MG/15ML suspension   Oral   Take 30 mLs by mouth every 6 (six) hours as needed. For indigestion         . calcium carbonate (TUMS) 500 MG chewable tablet   Oral   Chew 4 tablets by mouth daily as needed. For indigestion         . ibuprofen (ADVIL,MOTRIN) 200 MG tablet   Oral   Take 600 mg by mouth every 6 (six) hours as needed. For pain         . naproxen sodium (ANAPROX) 220 MG tablet   Oral   Take 220 mg by mouth 2 (two) times daily with a  meal.         . omega-3 acid ethyl esters (LOVAZA) 1 G capsule   Oral   Take 2 capsules (2 g total) by mouth 2 (two) times daily.   120 capsule   5   . varenicline (CHANTIX CONTINUING MONTH PAK) 1 MG tablet   Oral   Take 1 tablet (1 mg total) by mouth 2 (two) times daily.   60 tablet   0    BP 141/96  Pulse 100  Temp(Src) 98.2 F (36.8 C) (Oral)  Resp 18  Wt 171 lb 14.4 oz (77.973 kg)  SpO2 100%  Physical Exam  Nursing note and vitals reviewed. Constitutional: He is oriented to person, place, and time. He appears well-developed and well-nourished. No distress.  HENT:  Head: Normocephalic and atraumatic.  Right Ear: External ear normal. No drainage, swelling or tenderness. No mastoid tenderness. Tympanic membrane is not erythematous and not bulging. No middle ear effusion.  Left Ear: External ear normal. No drainage, swelling or tenderness. No mastoid tenderness. Tympanic membrane is not erythematous and not bulging.   No middle ear effusion.  Nose: Nose normal. No rhinorrhea.  Mouth/Throat: Uvula is midline and mucous membranes are normal. No trismus in the jaw. No uvula swelling. Posterior oropharyngeal erythema present. No oropharyngeal exudate or tonsillar abscesses.  Eyes: Conjunctivae are normal. Pupils are equal, round, and reactive to light. No scleral icterus.  Neck: Normal range of motion. Neck supple.  Cardiovascular: Normal rate, regular rhythm and normal heart sounds.  Exam reveals no gallop and no friction rub.   No murmur heard. Pulmonary/Chest: Effort normal and breath sounds normal. No respiratory distress. He has no wheezes. He has no rales. He exhibits no tenderness.  Abdominal: Soft. Bowel sounds are normal. He exhibits no distension. There is no tenderness.  Musculoskeletal: Normal range of motion. He exhibits no edema and no tenderness.  Lymphadenopathy:    He has no cervical adenopathy.  Neurological: He is alert and oriented to person, place, and time. He exhibits normal muscle tone. Coordination normal.  Skin: Skin is warm and dry. No rash noted. No erythema. No pallor.  Psychiatric: He has a normal mood and affect. His behavior is normal. Judgment and thought content normal.    ED Course  Procedures (including critical care time)  DIAGNOSTIC STUDIES: Oxygen Saturation is 100% on RA, normal by my interpretation.    COORDINATION OF CARE: 3:53 PM-Discussed treatment plan which includes rapid strep and a steroid with pt at bedside and pt agreed to plan.   Labs Review Labs Reviewed - No data to display Imaging Review No results found.  EKG Interpretation   None      Results for orders placed during the hospital encounter of 03/15/13  RAPID STREP SCREEN      Result Value Range   Streptococcus, Group A Screen (Direct) NEGATIVE  NEGATIVE   No results found.  Medications  dexamethasone (DECADRON) injection 10 mg (10 mg Intramuscular Given 03/15/13 1559)     MDM   Pharyngitis  Patient with right sided ear and throat pain - ear without abnormalities but right posterior pharynx is noted with erythema, strep is negative so I suspect this to be viral.  There is no clinical suspicion for PTA, ludwig's angina, soft tissue abscess.  I personally performed the services described in this documentation, which was scribed in my presence. The recorded information has been reviewed and is accurate.   Idalia Needle Joelyn Oms, [place] 03/15/13 1648

## 2013-03-16 NOTE — ED Provider Notes (Signed)
Medical screening examination/treatment/procedure(s) were performed by non-physician practitioner and as supervising physician I was immediately available for consultation/collaboration.  EKG Interpretation   None        Raevyn Sokol R. Dhyana Bastone, MD 03/16/13 2337 

## 2013-03-17 LAB — CULTURE, GROUP A STREP

## 2015-05-08 ENCOUNTER — Other Ambulatory Visit: Payer: Self-pay | Admitting: Occupational Medicine

## 2015-05-08 ENCOUNTER — Ambulatory Visit: Payer: Self-pay

## 2015-05-08 DIAGNOSIS — Z0000 Encounter for general adult medical examination without abnormal findings: Secondary | ICD-10-CM

## 2016-03-03 DIAGNOSIS — K6289 Other specified diseases of anus and rectum: Secondary | ICD-10-CM | POA: Diagnosis not present

## 2016-03-03 DIAGNOSIS — D869 Sarcoidosis, unspecified: Secondary | ICD-10-CM | POA: Diagnosis not present

## 2016-03-03 DIAGNOSIS — J45909 Unspecified asthma, uncomplicated: Secondary | ICD-10-CM | POA: Diagnosis not present

## 2016-03-07 ENCOUNTER — Encounter: Payer: Self-pay | Admitting: Gastroenterology

## 2016-03-15 ENCOUNTER — Encounter: Payer: Self-pay | Admitting: Gastroenterology

## 2016-03-15 ENCOUNTER — Ambulatory Visit (INDEPENDENT_AMBULATORY_CARE_PROVIDER_SITE_OTHER): Payer: BLUE CROSS/BLUE SHIELD | Admitting: Gastroenterology

## 2016-03-15 ENCOUNTER — Encounter (INDEPENDENT_AMBULATORY_CARE_PROVIDER_SITE_OTHER): Payer: Self-pay

## 2016-03-15 VITALS — BP 100/60 | HR 82 | Ht 70.0 in | Wt 168.0 lb

## 2016-03-15 DIAGNOSIS — L290 Pruritus ani: Secondary | ICD-10-CM | POA: Diagnosis not present

## 2016-03-15 MED ORDER — ALBENDAZOLE 200 MG PO TABS: 400.0000 mg | ORAL_TABLET | Freq: Once | ORAL | 0 refills | Status: AC

## 2016-03-15 NOTE — Progress Notes (Signed)
03/15/2016 Jackson Arnold [id number] 1966-01-27   HISTORY OF PRESENT ILLNESS:  This is a pleasant 51 year old male who is new to our practice.  Says that he was seen here in the 80's and underwent colonoscopy that he recalls being normal.  That was performed for complaints of rectal bleeding at that time.  He comes today at the request of Shanon Rosser, PA-C, for evaluation of anal itching/burning.  He tells me that this has been present since August, prior to that he never had any similar symptoms.  Itching is worse at night and it is preventing him from sleeping.  Sometimes he will go a week without any itching, but then it returns again.  Has tried Tucks pads and preparation H suppositories.  He denies any bleeding.  No other symptoms such as diarrhea, constipation, abdominal pain, etc.  Was seen at urgent care and they told him that they did not see any hemorrhoids or fissures.   Past Medical History:  Diagnosis Date  . Bronchitis   . Chest pain 06/2011   ED/observation visit  . H/O CT scan 06/2011   cardiac, normal  . History of EKG 5/13   sinus bradycardia  . Hyperlipidemia    tricor prior  . Pulmonary sarcoidosis ([place]) 1998  . Tobacco use    Past Surgical History:  Procedure Laterality Date  . COLONOSCOPY     remote past, [place]  . LUNG BIOPSY  1998  . TONSILLECTOMY      reports that he has been smoking.  He has a 27.00 pack-year smoking history. He has never used smokeless tobacco. He reports that he does not drink alcohol or use drugs. family history includes Cancer in his maternal aunt and mother; Diabetes in his mother; Lung cancer in his father; Stroke in his paternal grandmother. Allergies  Allergen Reactions  . Tramadol Nausea And Vomiting      Outpatient Encounter Prescriptions as of 03/15/2016  Medication Sig  . albuterol (PROVENTIL HFA;VENTOLIN HFA) 108 (90 BASE) MCG/ACT inhaler Inhale 2 puffs into the lungs every 6 (six) hours as needed for wheezing.  Marland Kitchen  ibuprofen (ADVIL,MOTRIN) 200 MG tablet Take 600 mg by mouth every 6 (six) hours as needed. For pain  . pseudoephedrine (SUDAFED) 30 MG tablet Take 30 mg by mouth every 4 (four) hours as needed for congestion.   No facility-administered encounter medications on file as of 03/15/2016.      REVIEW OF SYSTEMS  : All other systems reviewed and negative except where noted in the History of Present Illness.   PHYSICAL EXAM: BP 100/60   Pulse 82   Ht 5\' 10"  (1.778 m)   Wt 168 lb (76.2 kg)   SpO2 99%   BMI 24.11 kg/m  General: Well developed male in no acute distress Head: Normocephalic and atraumatic Eyes:  Sclerae anicteric, conjunctiva pink. Ears: Normal auditory acuity Lungs: Clear throughout to auscultation Heart: Regular rate and rhythm Abdomen: Soft, non-distended. Normal bowel sounds.  Non-tender. Rectal:  No external abnormalities noted except a very tiny area of excoriation likely from scratching.  DRE did not reveal any masses. Musculoskeletal: Symmetrical with no gross deformities  Skin: No lesions on visible extremities Extremities: No edema  Neurological: Alert oriented x 4, grossly non-focal Psychological:  Alert and cooperative. Normal mood and affect  ASSESSMENT AND PLAN: -Pruritis ani:  No abnormalities noted to be causing this issue.  Symptoms much worse at night.  No other associated symptoms.  Will  try to empirically treat for pinworms with albendazole 400 mg then repeat the dose again in 2 weeks.  He will call back after treatment if he continues to have symptoms at which time this will need to be re-addressed.  Can use Zinc oxide based product for the excoriated area as well.  CC:  Long, [place], PA-C

## 2016-03-15 NOTE — Patient Instructions (Signed)
We have sent the following medications to your pharmacy for you to pick up at your convenience: Albendazole 400 mg on an empty stomach  Call back after 2 weeks after treatment to update [place] on your condition, ask for Patty.   Please purchase the following medications over the counter and take as directed: Desitin or other zinc oxide cream.

## 2016-03-15 NOTE — Progress Notes (Signed)
Agree with assessment. Unclear etiology based on lack of exam findings. Trial of albendazole not unreasonable. If symptoms persist would give a trial of hydrocortizone cream for 2 weeks. He should otherwise avoid the use of wet-wipes to the area which can make this worse.

## 2016-03-23 ENCOUNTER — Institutional Professional Consult (permissible substitution): Payer: Self-pay | Admitting: Internal Medicine

## 2017-04-13 DIAGNOSIS — K644 Residual hemorrhoidal skin tags: Secondary | ICD-10-CM | POA: Diagnosis not present

## 2017-04-13 DIAGNOSIS — K6289 Other specified diseases of anus and rectum: Secondary | ICD-10-CM | POA: Diagnosis not present

## 2017-04-20 ENCOUNTER — Encounter: Payer: Self-pay | Admitting: Gastroenterology

## 2017-04-20 ENCOUNTER — Ambulatory Visit: Payer: BLUE CROSS/BLUE SHIELD | Admitting: Gastroenterology

## 2017-04-20 VITALS — BP 126/82 | HR 72 | Ht 71.0 in | Wt 174.0 lb

## 2017-04-20 DIAGNOSIS — Z0000 Encounter for general adult medical examination without abnormal findings: Secondary | ICD-10-CM | POA: Insufficient documentation

## 2017-04-20 DIAGNOSIS — L290 Pruritus ani: Secondary | ICD-10-CM | POA: Diagnosis not present

## 2017-04-20 DIAGNOSIS — Z1211 Encounter for screening for malignant neoplasm of colon: Secondary | ICD-10-CM

## 2017-04-20 MED ORDER — NA SULFATE-K SULFATE-MG SULF 17.5-3.13-1.6 GM/177ML PO SOLN: 1.0000 | ORAL | 0 refills | Status: DC

## 2017-04-20 NOTE — Progress Notes (Signed)
Agree with assessment and plan. We will await colonoscopy.

## 2017-04-20 NOTE — Progress Notes (Signed)
04/20/2017 Jackson Arnold [id number] [birth year]   HISTORY OF PRESENT ILLNESS: This is a 52 year old male who was seen by me over a year ago with complaints of itching of his perianal area.  There were no significant findings on exam so he was treated empirically for pinworms with albendazole and was told to try some topical zinc oxide.  He says that the pinworm treatment did not help his symptoms.  He continues to have the same itching intermittently.  He denies any rectal bleeding.  He has not had a colonoscopy since the 1980s at which time it was apparently performed for complaints of rectal bleeding.  He would like to schedule colonoscopy at this time.  He says that his PCP reexamined in his bottom recently and they did not see any significant findings at that time either.  He has been using hydrocortisone cream topically as needed and that seems to help to some degree.   Past Medical History:  Diagnosis Date  . Bronchitis   . Chest pain 06/2011   ED/observation visit  . H/O CT scan 06/2011   cardiac, normal  . History of EKG 5/13   sinus bradycardia  . Hyperlipidemia    tricor prior  . 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 Tobacco use    Past Surgical History:  Procedure Laterality Date  . COLONOSCOPY     remote past, [place]  . LUNG BIOPSY  1998  . TONSILLECTOMY      reports that he has been smoking.  He has a 27.00 pack-year smoking history. he has never used smokeless tobacco. He reports that he does not drink alcohol or use drugs. family history includes Cancer in his maternal aunt and mother; Diabetes in his mother; Lung cancer in his father; Stroke in his paternal grandmother. Allergies  Allergen Reactions  . Tramadol Nausea And Vomiting      Outpatient Encounter Medications as of 04/20/2017  Medication Sig  . albuterol (PROVENTIL HFA;VENTOLIN HFA) 108 (90 BASE) MCG/ACT inhaler Inhale 2 puffs into the lungs every 6 (six) hours as needed for wheezing.  Marland Kitchen ibuprofen  (ADVIL,MOTRIN) 200 MG tablet Take 600 mg by mouth every 6 (six) hours as needed. For pain  . pseudoephedrine (SUDAFED) 30 MG tablet Take 30 mg by mouth every 4 (four) hours as needed for congestion.   No facility-administered encounter medications on file as of 04/20/2017.      REVIEW OF SYSTEMS  : All other systems reviewed and negative except where noted in the History of Present Illness.   PHYSICAL EXAM: BP 126/82   Pulse 72   Ht 5\' 11"  (1.803 m)   Wt 174 lb (78.9 kg)   BMI 24.27 kg/m  General: Well developed male in no acute distress Head: Normocephalic and atraumatic Eyes:  Sclerae anicteric, conjunctiva pink. Ears: Normal auditory acuity Lungs: Clear throughout to auscultation; no increased WOB Heart: Regular rate and rhythm; no M/R/G. Abdomen: Soft, non-distended.  BS present.  Non-tender. Rectal:  Will be done at the time of colonoscopy. Musculoskeletal: Symmetrical with no gross deformities  Skin: No lesions on visible extremities Extremities: No edema  Neurological: Alert oriented x 4, grossly non-focal Psychological:  Alert and cooperative. Normal mood and affect  ASSESSMENT AND PLAN: *Screening colonoscopy:  Will schedule with Dr. Havery Moros.  The risks, benefits, and alternatives to colonoscopy were discussed with the patient and he consents to proceed.  *Pruritis ani:  Has been on and off for quite some time.  Was  treated empirically for pinworms in the past with no improvement.  No other findings to explain symptoms on exam previously.  Hydrocortisone cream helps some.  Can be re-evaluated at the time of colonoscopy.  Otherwise continue hydrocortisone cream prn for now.  CC:  No ref. provider found

## 2017-04-20 NOTE — Patient Instructions (Signed)

## 2017-05-01 ENCOUNTER — Encounter: Payer: Self-pay | Admitting: Gastroenterology

## 2017-05-15 ENCOUNTER — Other Ambulatory Visit: Payer: Self-pay

## 2017-05-15 ENCOUNTER — Ambulatory Visit (AMBULATORY_SURGERY_CENTER): Payer: BLUE CROSS/BLUE SHIELD | Admitting: Gastroenterology

## 2017-05-15 ENCOUNTER — Encounter: Payer: Self-pay | Admitting: Gastroenterology

## 2017-05-15 VITALS — BP 91/57 | HR 70 | Temp 98.0°F | Resp 12 | Ht 71.0 in | Wt 174.0 lb

## 2017-05-15 DIAGNOSIS — Z1211 Encounter for screening for malignant neoplasm of colon: Secondary | ICD-10-CM

## 2017-05-15 DIAGNOSIS — D123 Benign neoplasm of transverse colon: Secondary | ICD-10-CM

## 2017-05-15 DIAGNOSIS — D127 Benign neoplasm of rectosigmoid junction: Secondary | ICD-10-CM | POA: Diagnosis not present

## 2017-05-15 HISTORY — PX: COLONOSCOPY: SHX174

## 2017-05-15 LAB — HM COLONOSCOPY

## 2017-05-15 MED ORDER — SODIUM CHLORIDE 0.9 % IV SOLN: 500.0000 mL | Freq: Once | INTRAVENOUS | Status: DC

## 2017-05-15 NOTE — Progress Notes (Signed)
To PACU, VSS. Report to [email or web address] 

## 2017-05-15 NOTE — Progress Notes (Signed)
Pt's states no medical or surgical changes since previsit or office visit. 

## 2017-05-15 NOTE — Progress Notes (Signed)
Called to room to assist during endoscopic procedure.  Patient ID and intended procedure confirmed with present staff. Received instructions for my participation in the procedure from the performing physician.  

## 2017-05-15 NOTE — Patient Instructions (Signed)
YOU HAD AN ENDOSCOPIC PROCEDURE TODAY AT THE [facility]:   Refer to the procedure report that was given to you for any specific questions about what was found during the examination.  If the procedure report does not answer your questions, please call your gastroenterologist to clarify.  If you requested that your care partner not be given the details of your procedure findings, then the procedure report has been included in a sealed envelope for you to review at your convenience later.  YOU SHOULD EXPECT: Some feelings of bloating in the abdomen. Passage of more gas than usual.  Walking can help get rid of the air that was put into your GI tract during the procedure and reduce the bloating. If you had a lower endoscopy (such as a colonoscopy or flexible sigmoidoscopy) you may notice spotting of blood in your stool or on the toilet paper. If you underwent a bowel prep for your procedure, you may not have a normal bowel movement for a few days.  Please Note:  You might notice some irritation and congestion in your nose or some drainage.  This is from the oxygen used during your procedure.  There is no need for concern and it should clear up in a day or so.  SYMPTOMS TO REPORT IMMEDIATELY:   Following lower endoscopy (colonoscopy or flexible sigmoidoscopy):  Excessive amounts of blood in the stool  Significant tenderness or worsening of abdominal pains  Swelling of the abdomen that is new, acute  Fever of 100F or higher   For urgent or emergent issues, a gastroenterologist can be reached at any hour by calling [phone].   DIET:  We do recommend a small meal at first, but then you may proceed to your regular diet.  Drink plenty of fluids but you should avoid alcoholic beverages for 24 hours.  ACTIVITY:  You should plan to take it easy for the rest of today and you should NOT DRIVE or use heavy machinery until tomorrow (because of the sedation medicines used during the test).     FOLLOW UP: Our staff will call the number listed on your records the next business day following your procedure to check on you and address any questions or concerns that you may have regarding the information given to you following your procedure. If we do not reach you, we will leave a message.  However, if you are feeling well and you are not experiencing any problems, there is no need to return our call.  We will assume that you have returned to your regular daily activities without incident.  If any biopsies were taken you will be contacted by phone or by letter within the next 1-3 weeks.  Please call us at [phone] if you have not heard about the biopsies in 3 weeks.    SIGNATURES/CONFIDENTIALITY: You and/or your care partner have signed paperwork which will be entered into your electronic medical record.  These signatures attest to the fact that that the information above on your After Visit Summary has been reviewed and is understood.  Full responsibility of the confidentiality of this discharge information lies with you and/or your care-partner.  Read all of the handouts given to you by your recovery room nurse. 

## 2017-05-15 NOTE — Op Note (Signed)
[facility] Patient Name: Jackson Arnold Procedure Date: 05/15/2017 3:18 PM MRN: [id number] Endoscopist: Remo Lipps P. Eliazer Hemphill MD, MD Age: 52 Referring MD:  Date of Birth: August 18, 1965 Gender: Male Account #: [id number] Procedure:                Colonoscopy Indications:              Screening for colorectal malignant neoplasm, This                            is the patient's first colonoscopy Medicines:                Monitored Anesthesia Care Procedure:                Pre-Anesthesia Assessment:                           - Prior to the procedure, a History and Physical                            was performed, and patient medications and                            allergies were reviewed. The patient's tolerance of                            previous anesthesia was also reviewed. The risks                            and benefits of the procedure and the sedation                            options and risks were discussed with the patient.                            All questions were answered, and informed consent                            was obtained. Prior Anticoagulants: The patient has                            taken no previous anticoagulant or antiplatelet                            agents. ASA Grade Assessment: II - A patient with                            mild systemic disease. After reviewing the risks                            and benefits, the patient was deemed in                            satisfactory condition to undergo the procedure.  After obtaining informed consent, the colonoscope                            was passed under direct vision. Throughout the                            procedure, the patient's blood pressure, pulse, and                            oxygen saturations were monitored continuously. The                            Model CF-HQ190L [id number]) scope was introduced                            through the anus and  advanced to the the cecum,                            identified by appendiceal orifice and ileocecal                            valve. The colonoscopy was performed without                            difficulty. The patient tolerated the procedure                            well. The quality of the bowel preparation was                            good. The ileocecal valve, appendiceal orifice, and                            rectum were photographed. Scope In: 3:28:41 PM Scope Out: 1:51:76 PM Scope Withdrawal Time: 0 hours 15 minutes 4 seconds  Total Procedure Duration: 0 hours 17 minutes 58 seconds  Findings:                 The perianal and digital rectal examinations were                            normal.                           A 5 mm polyp was found in the transverse colon. The                            polyp was sessile. The polyp was removed with a                            cold snare. Resection and retrieval were complete.                           A 4 mm polyp was found in the recto-sigmoid colon.  The polyp was sessile. The polyp was removed with a                            cold snare. Resection and retrieval were complete.                           Internal hemorrhoids were found during                            retroflexion. The hemorrhoids were small.                           The exam was otherwise without abnormality. Complications:            No immediate complications. Estimated blood loss:                            Minimal. Estimated Blood Loss:     Estimated blood loss was minimal. Impression:               - One 5 mm polyp in the transverse colon, removed                            with a cold snare. Resected and retrieved.                           - One 4 mm polyp at the recto-sigmoid colon,                            removed with a cold snare. Resected and retrieved.                           - Internal hemorrhoids.                            - The examination was otherwise normal. Recommendation:           - Patient has a contact number available for                            emergencies. The signs and symptoms of potential                            delayed complications were discussed with the                            patient. Return to normal activities tomorrow.                            Written discharge instructions were provided to the                            patient.                           - Resume previous diet.                           -  Continue present medications.                           - Await pathology results.                           - Repeat colonoscopy is recommended for                            surveillance. The colonoscopy date will be                            determined after pathology results from today's                            exam become available for review. Remo Lipps P. Alyza Artiaga MD, MD 05/15/2017 3:49:42 PM This report has been signed electronically.

## 2017-05-16 ENCOUNTER — Telehealth: Payer: Self-pay

## 2017-05-16 NOTE — Telephone Encounter (Signed)
  Follow up Call-  Call back number 05/15/2017  Post procedure Call Back phone  # [phone] cell  Permission to leave phone message Yes  Some recent data might be hidden     Patient questions:  Do you have a fever, pain , or abdominal swelling? No. Pain Score  0 *  Have you tolerated food without any problems? Yes.    Have you been able to return to your normal activities? Yes.    Do you have any questions about your discharge instructions: Diet   No. Medications  No. Follow up visit  No.  Do you have questions or concerns about your Care? No.  Actions: * If pain score is 4 or above: No action needed, pain <4.

## 2017-05-23 ENCOUNTER — Encounter: Payer: Self-pay | Admitting: Gastroenterology

## 2017-06-18 DIAGNOSIS — D860 Sarcoidosis of lung: Secondary | ICD-10-CM | POA: Diagnosis not present

## 2017-06-18 DIAGNOSIS — J019 Acute sinusitis, unspecified: Secondary | ICD-10-CM | POA: Diagnosis not present

## 2017-07-04 DIAGNOSIS — M436 Torticollis: Secondary | ICD-10-CM | POA: Diagnosis not present

## 2017-08-14 ENCOUNTER — Telehealth: Payer: Self-pay

## 2017-08-14 ENCOUNTER — Encounter: Payer: Self-pay | Admitting: Medical

## 2017-08-14 ENCOUNTER — Ambulatory Visit: Payer: BLUE CROSS/BLUE SHIELD | Admitting: Medical

## 2017-08-14 VITALS — BP 120/82 | HR 84 | Temp 98.2°F | Resp 16 | Ht 71.5 in | Wt 165.6 lb

## 2017-08-14 DIAGNOSIS — J329 Chronic sinusitis, unspecified: Secondary | ICD-10-CM

## 2017-08-14 DIAGNOSIS — M436 Torticollis: Secondary | ICD-10-CM

## 2017-08-14 DIAGNOSIS — Z862 Personal history of diseases of the blood and blood-forming organs and certain disorders involving the immune mechanism: Secondary | ICD-10-CM | POA: Insufficient documentation

## 2017-08-14 DIAGNOSIS — Z0000 Encounter for general adult medical examination without abnormal findings: Secondary | ICD-10-CM | POA: Diagnosis not present

## 2017-08-14 DIAGNOSIS — Z136 Encounter for screening for cardiovascular disorders: Secondary | ICD-10-CM

## 2017-08-14 DIAGNOSIS — D869 Sarcoidosis, unspecified: Secondary | ICD-10-CM | POA: Diagnosis not present

## 2017-08-14 DIAGNOSIS — Z720 Tobacco use: Secondary | ICD-10-CM | POA: Insufficient documentation

## 2017-08-14 DIAGNOSIS — R05 Cough: Secondary | ICD-10-CM | POA: Diagnosis not present

## 2017-08-14 DIAGNOSIS — R29898 Other symptoms and signs involving the musculoskeletal system: Secondary | ICD-10-CM | POA: Diagnosis not present

## 2017-08-14 DIAGNOSIS — Z1159 Encounter for screening for other viral diseases: Secondary | ICD-10-CM | POA: Diagnosis not present

## 2017-08-14 DIAGNOSIS — Z7185 Encounter for immunization safety counseling: Secondary | ICD-10-CM

## 2017-08-14 DIAGNOSIS — Z7189 Other specified counseling: Secondary | ICD-10-CM

## 2017-08-14 DIAGNOSIS — R059 Cough, unspecified: Secondary | ICD-10-CM

## 2017-08-14 MED ORDER — PROAIR HFA 108 (90 BASE) MCG/ACT IN AERS: 2.0000 | INHALATION_SPRAY | Freq: Four times a day (QID) | RESPIRATORY_TRACT | 1 refills | Status: DC | PRN

## 2017-08-14 NOTE — Patient Instructions (Signed)
Thanks for trusting [place] with your health care and for coming in for a physical today.  Below are some general recommendations I have for you:  Yearly screenings See your eye doctor yearly for routine vision care. See your dentist yearly for routine dental care including hygiene visits twice yearly. See me here yearly for a routine physical and preventative care visit   Specific Concerns today:  . I recommend a chest CT for chronic cough and lung cancer screen . You are up to date on Colonoscopy . We did a PSA prostate check today . Working on trying to quit tobacco   Please follow up yearly for a physical.   Preventative Care for Adults - Male      [place]:  A routine yearly physical is a good way to check in with your primary care provider about your health and preventive screening. It is also an opportunity to share updates about your health and any concerns you have, and receive a thorough all-over exam.   Most health insurance companies pay for at least some preventative services.  Check with your health plan for specific coverages.  WHAT PREVENTATIVE SERVICES DO WOMEN NEED?  Adult men should have their weight and blood pressure checked regularly.   Men age 27 and older should have their cholesterol levels checked regularly.  Beginning at age 58 and continuing to age 88, men should be screened for colorectal cancer.  Certain people may need continued testing until age 16.  Updating vaccinations is part of preventative care.  Vaccinations help protect against diseases such as the flu.  Osteoporosis is a disease in which the bones lose minerals and strength as we age. Men ages 36 and over should discuss this with their caregivers  Lab tests are generally done as part of preventative care to screen for anemia and blood disorders, to screen for problems with the kidneys and liver, to screen for bladder problems, to check blood sugar, and to check your  cholesterol level.  Preventative services generally include counseling about diet, exercise, avoiding tobacco, drugs, excessive alcohol consumption, and sexually transmitted infections.    GENERAL RECOMMENDATIONS FOR GOOD HEALTH:  Healthy diet:  Eat a variety of foods, including fruit, vegetables, animal or vegetable protein, such as meat, fish, chicken, and eggs, or beans, lentils, tofu, and grains, such as rice.  Drink plenty of water daily.  Decrease saturated fat in the diet, avoid lots of red meat, processed foods, sweets, fast foods, and fried foods.  Exercise:  Aerobic exercise helps maintain good heart health. At least 30-40 minutes of moderate-intensity exercise is recommended. For example, a brisk walk that increases your heart rate and breathing. This should be done on most days of the week.   Find a type of exercise or a variety of exercises that you enjoy so that it becomes a part of your daily life.  Examples are running, walking, swimming, water aerobics, and biking.  For motivation and support, explore group exercise such as aerobic class, spin class, Zumba, Yoga,or  martial arts, etc.    Set exercise goals for yourself, such as a certain weight goal, walk or run in a race such as a 5k walk/run.  Speak to your primary care provider about exercise goals.  Disease prevention:  If you smoke or chew tobacco, find out from your caregiver how to quit. It can literally save your life, no matter how long you have been a tobacco user. If you do not  use tobacco, never begin.   Maintain a healthy diet and normal weight. Increased weight leads to problems with blood pressure and diabetes.   The Body Mass Index or BMI is a way of measuring how much of your body is fat. Having a BMI above 27 increases the risk of heart disease, diabetes, hypertension, stroke and other problems related to obesity. Your caregiver can help determine your BMI and based on it develop an exercise and dietary  program to help you achieve or maintain this important measurement at a healthful level.  High blood pressure causes heart and blood vessel problems.  Persistent high blood pressure should be treated with medicine if weight loss and exercise do not work.   Fat and cholesterol leaves deposits in your arteries that can block them. This causes heart disease and vessel disease elsewhere in your body.  If your cholesterol is found to be high, or if you have heart disease or certain other medical conditions, then you may need to have your cholesterol monitored frequently and be treated with medication.   Ask if you should have a cardiac stress test if your history suggests this. A stress test is a test done on a treadmill that looks for heart disease. This test can find disease prior to there being a problem.  Osteoporosis is a disease in which the bones lose minerals and strength as we age. This can result in serious bone fractures. Risk of osteoporosis can be identified using a bone density scan. Men ages 74 and over should discuss this with their caregivers. Ask your caregiver whether you should be taking a calcium supplement and Vitamin D, to reduce the rate of osteoporosis.   Avoid drinking alcohol in excess (more than two drinks per day).  Avoid use of street drugs. Do not share needles with anyone. Ask for professional help if you need assistance or instructions on stopping the use of alcohol, cigarettes, and/or drugs.  Brush your teeth twice a day with fluoride toothpaste, and floss once a day. Good oral hygiene prevents tooth decay and gum disease. The problems can be painful, unattractive, and can cause other health problems. Visit your dentist for a routine oral and dental check up and preventive care every 6-12 months.   Look at your skin regularly.  Use a mirror to look at your back. Notify your caregivers of changes in moles, especially if there are changes in shapes, colors, a size larger than  a pencil eraser, an irregular border, or development of new moles.  Safety:  Use seatbelts 100% of the time, whether driving or as a passenger.  Use safety devices such as hearing protection if you work in environments with loud noise or significant background noise.  Use safety glasses when doing any work that could send debris in to the eyes.  Use a helmet if you ride a bike or motorcycle.  Use appropriate safety gear for contact sports.  Talk to your caregiver about gun safety.  Use sunscreen with a SPF (or skin protection factor) of 15 or greater.  Lighter skinned people are at a greater risk of skin cancer. Don't forget to also wear sunglasses in order to protect your eyes from too much damaging sunlight. Damaging sunlight can accelerate cataract formation.   Practice safe sex. Use condoms. Condoms are used for birth control and to help reduce the spread of sexually transmitted infections (or STIs).  Some of the STIs are gonorrhea (the clap), chlamydia, syphilis, trichomonas, herpes, HPV (human  papilloma virus) and HIV (human immunodeficiency virus) which causes AIDS. The herpes, HIV and HPV are viral illnesses that have no cure. These can result in disability, cancer and death.   Keep carbon monoxide and smoke detectors in your home functioning at all times. Change the batteries every 6 months or use a model that plugs into the wall.   Vaccinations:  Stay up to date with your tetanus shots and other required immunizations. You should have a booster for tetanus every 10 years. Be sure to get your flu shot every year, since 5%-20% of the [place] population comes down with the flu. The flu vaccine changes each year, so being vaccinated once is not enough. Get your shot in the fall, before the flu season peaks.   Other vaccines to consider:  Human Papilloma Virus or HPV causes cancer of the cervix, and other infections that can be transmitted from person to person. There is a vaccine for HPV, and  males should get immunized between the ages of 70 and 31. It requires a series of 3 shots.   Pneumococcal vaccine to protect against certain types of pneumonia.  This is normally recommended for adults age 5 or older.  However, adults younger than 50 years old with certain underlying conditions such as diabetes, heart or lung disease should also receive the vaccine.  Shingles vaccine to protect against Varicella Zoster if you are older than age 51, or younger than 50 years old with certain underlying illness.  If you have not had the Shingrix vaccine, please call your insurer to inquire about coverage for the Shingrix vaccine given in 2 doses.   Some insurers cover this vaccine after age 87, some cover this after age 40.  If your insurer covers this, then call to schedule appointment to have this vaccine here  Hepatitis A vaccine to protect against a form of infection of the liver by a virus acquired from food.  Hepatitis B vaccine to protect against a form of infection of the liver by a virus acquired from blood or body fluids, particularly if you work in health care.  If you plan to travel internationally, check with your local health department for specific vaccination recommendations.   What should I know about cancer screening? Many types of cancers can be detected early and may often be prevented. Lung Cancer  You should be screened every year for lung cancer if: ? You are a current smoker who has smoked for at least 30 years. ? You are a former smoker who has quit within the past 15 years.  Talk to your health care provider about your screening options, when you should start screening, and how often you should be screened.  Colorectal Cancer  Routine colorectal cancer screening usually begins at 50 years of age and should be repeated every 5-10 years until you are 50 years old. You may need to be screened more often if early forms of precancerous polyps or small growths are found.  Your health care provider may recommend screening at an earlier age if you have risk factors for colon cancer.  Your health care provider may recommend using home test kits to check for hidden blood in the stool.  A small camera at the end of a tube can be used to examine your colon (sigmoidoscopy or colonoscopy). This checks for the earliest forms of colorectal cancer.  Prostate and Testicular Cancer  Depending on your age and overall health, your health care provider may do certain  tests to screen for prostate and testicular cancer.  Talk to your health care provider about any symptoms or concerns you have about testicular or prostate cancer.  Skin Cancer  Check your skin from head to toe regularly.  Tell your health care provider about any new moles or changes in moles, especially if: ? There is a change in a mole's size, shape, or color. ? You have a mole that is larger than a pencil eraser.  Always use sunscreen. Apply sunscreen liberally and repeat throughout the day.  Protect yourself by wearing long sleeves, pants, a wide-brimmed hat, and sunglasses when outside.

## 2017-08-14 NOTE — Telephone Encounter (Signed)
Not yet I had to leave a message for him to call me back.

## 2017-08-14 NOTE — Telephone Encounter (Signed)
Pt aware.

## 2017-08-14 NOTE — Progress Notes (Signed)
Subjective:   HPI  Jackson Arnold is a 52 y.o. male who presents for Chief Complaint  Patient presents with  . CPE    NP/ old pt CPE fasting   want RX for Proair name brand works better     Medical care team includes: Richie Vadala, Camelia Eng, PA-C here for primary care Dentist Eye doctor  Concerns: He reports in recent months has been going to [facility] urgent care a few times for sinus infection and torticollis.  Multiple times for torticollis.   He notes in the last 2 weeks has had some weakness of the left forearm, was using a chainsaw a few weeks ago.  Denies specific injury but has had some weakness of the left forearm since.  He is right-handed.  He notes a PFT test was done at his employer back in April and was reportedly normal  He does get a little bit of wheezing and cough this time a year during pollen season and higher heat.  Using albuterol on average 1 time a week.  Pro-air works better than Proventil for him.  Reviewed their medical, surgical, family, social, medication, and allergy history and updated chart as appropriate.  Past Medical History:  Diagnosis Date  . Bronchitis   . Chest pain 06/2011   ED/observation visit  . H/O CT scan 06/2011   cardiac, normal  . History of EKG 5/13   sinus bradycardia  . Hyperlipidemia    tricor prior  . Nausea and vomiting after administration of anesthetic agent   . 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 Tobacco use     Past Surgical History:  Procedure Laterality Date  . COLONOSCOPY  05/2017   polyps, repeat 2024; Dr. Havery Moros  . LUNG BIOPSY  1998  . TONSILLECTOMY      Social History   Socioeconomic History  . Marital status: Married    Spouse name: Not on file  . Number of children: Not on file  . Years of education: Not on file  . Highest education level: Not on file  Occupational History  . Not on file  Social Needs  . Financial resource strain: Not on file  . Food insecurity:    Worry: Not on file     Inability: Not on file  . Transportation needs:    Medical: Not on file    Non-medical: Not on file  Tobacco Use  . Smoking status: Current Every Day Smoker    Packs/day: 1.00    Years: 30.00    Pack years: 30.00    Types: Cigarettes  . Smokeless tobacco: Never Used  Substance and Sexual Activity  . Alcohol use: No  . Drug use: No  . Sexual activity: Not on file  Lifestyle  . Physical activity:    Days per week: Not on file    Minutes per session: Not on file  . Stress: Not on file  Relationships  . Social connections:    Talks on phone: Not on file    Gets together: Not on file    Attends religious service: Not on file    Active member of club or organization: Not on file    Attends meetings of clubs or organizations: Not on file    Relationship status: Not on file  . Intimate partner violence:    Fear of current or ex partner: Not on file    Emotionally abused: Not on file    Physically abused: Not on file  Forced sexual activity: Not on file  Other Topics Concern  . 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    08/2017    Family History  Problem Relation Age of Onset  . Diabetes Mother   . Cancer Mother        skin, lymphoma  . Lung cancer Father   . Cancer Maternal Aunt        colon  . Colon cancer Maternal Aunt   . Stroke Paternal Grandmother   . Hypertension Neg Hx   . Esophageal cancer Neg Hx   . Liver cancer Neg Hx   . Pancreatic cancer Neg Hx   . Prostate cancer Neg Hx   . Rectal cancer Neg Hx   . Stomach cancer Neg Hx      Current Outpatient Medications:  .  albuterol (PROVENTIL HFA;VENTOLIN HFA) 108 (90 BASE) MCG/ACT inhaler, Inhale 2 puffs into the lungs every 6 (six) hours as needed for wheezing., Disp: , Rfl:  .  ibuprofen (ADVIL,MOTRIN) 200 MG tablet, Take 600 mg by mouth every 6 (six) hours as needed. For pain, Disp: , Rfl:  .  PROAIR HFA  108 (90 Base) MCG/ACT inhaler, Inhale 2 puffs into the lungs every 6 (six) hours as needed for wheezing or shortness of breath., Disp: 18 g, Rfl: 1 .  pseudoephedrine (SUDAFED) 30 MG tablet, Take 30 mg by mouth every 4 (four) hours as needed for congestion., Disp: , Rfl:   Current Facility-Administered Medications:  .  0.9 %  sodium chloride infusion, 500 mL, Intravenous, Once, Armbruster, Carlota Raspberry, MD  Allergies  Allergen Reactions  . Tramadol Nausea And Vomiting    Review of Systems Constitutional: -fever, -chills, -sweats, -unexpected weight change, -decreased appetite, -fatigue Allergy: -sneezing, -itching, -congestion Dermatology: -changing moles, --rash, -lumps ENT: -runny nose, -ear pain, -sore throat, -hoarseness, -sinus pain, -teeth pain, - ringing in ears, -hearing loss, -nosebleeds Cardiology: -chest pain, -palpitations, -swelling, -difficulty breathing when lying flat, -waking up short of breath Respiratory: +cough, -shortness of breath, -difficulty breathing with exercise or exertion, -wheezing, -coughing up blood Gastroenterology: -abdominal pain, -nausea, -vomiting, -diarrhea, -constipation, -blood in stool, -changes in bowel movement, -difficulty swallowing or eating Hematology: -bleeding, -bruising  Musculoskeletal: +joint aches, -muscle aches, -joint swelling, -back pain, -neck pain, -cramping, -changes in gait Ophthalmology: denies vision changes, eye redness, itching, discharge Urology: -burning with urination, -difficulty urinating, -blood in urine, -urinary frequency, -urgency, -incontinence Neurology: -headache, -weakness, -tingling, -numbness, -memory loss, -falls, -dizziness Psychology: -depressed mood, -agitation, -sleep problems Male GU: no testicular mass, pain, no lymph nodes swollen, no swelling, no rash.     Objective:  BP 120/82   Pulse 84   Temp 98.2 F (36.8 C) (Oral)   Resp 16   Ht 5' 11.5" (1.816 m)   Wt 165 lb 9.6 oz (75.1 kg)   SpO2 98%   BMI  22.77 kg/m   General appearance: alert, no distress, WD/WN, African American male Skin: unremarkable HEENT: normocephalic, conjunctiva/corneas normal, sclerae anicteric, PERRLA, EOMi, nares patent, no discharge or erythema, pharynx normal Oral cavity: MMM, tongue normal, teeth normal Neck: supple, no lymphadenopathy, no thyromegaly, no masses, normal ROM, no bruits Chest: non tender, normal shape and expansion Heart: RRR, normal S1, S2, no murmurs Lungs: somewhat decreased breath sounds, no wheezes, rhonchi, or rales Abdomen: +bs, soft, non tender, non distended, no masses, no hepatomegaly, no splenomegaly, no bruits Back: non tender,  normal ROM, no scoliosis Musculoskeletal: left forearm without deformity or tenderness, upper extremities non tender, no obvious deformity, normal ROM throughout, lower extremities non tender, no obvious deformity, normal ROM throughout Extremities: no edema, no cyanosis, no clubbing Pulses: 2+ symmetric, upper and lower extremities, normal cap refill Neurological: alert, oriented x 3, CN2-12 intact, strength normal upper extremities and lower extremities, sensation normal throughout, DTRs 2+ throughout, no cerebellar signs, gait normal Psychiatric: normal affect, behavior normal, pleasant  GU: normal male external genitalia,circumcised, nontender, no masses, no hernia, no lymphadenopathy Rectal: prostate moderately enlarged, no nodules, anus normal tone   Adult ECG Report  Indication: physical, screening   Rate: 68 bpm  Rhythm: normal sinus rhythm  QRS Axis: 65 degrees  PR Interval: 148 ms  QRS Duration: 38ms  QTc: 449ms  Conduction Disturbances: possible left atrial enlaregement  Other Abnormalities: none  Patient's cardiac risk factors are: male gender and smoking/ tobacco exposure.  EKG comparison: none  Narrative Interpretation: possible left atrial enlargement, no other acute changes     Assessment and Plan :   Encounter Diagnoses  Name  Primary?  . Encounter for health maintenance examination in adult Yes  . Cough   . Tobacco use   . Chronic sinusitis, unspecified location   . History of sarcoidosis   . Torticollis   . Screening for heart disease   . Need for hepatitis C screening test   . Sarcoidosis   . Left arm weakness   . Vaccine counseling     Physical exam - discussed and counseled on healthy lifestyle, diet, exercise, preventative care, vaccinations, sick and well care, proper use of emergency dept and after hours care, and addressed their concerns.    Health screening: See your eye doctor yearly for routine vision care. See your dentist yearly for routine dental care including hygiene visits twice yearly.  Cancer screening Reviewed colonoscopy on file that is up to date  Discussed PSA, prostate exam, and prostate cancer screening risks/benefits.     Vaccinations: Advised yearly influenza vaccine  Counseled on Shingles vaccine at age 45 years and older Patient will check insurance coverage for this and consider vaccination  Recommended pneumococcal 23 vaccine  He is up to date on Td vaccine  Other issues discussed: Left arm weakness likely mild strain due to recent prolonged use of chainsaw  CT chest given long term smoker hx/o, chronic cough, hx/o sarcoidosis  Enlarged prostate without obstructive symptom - PSA today  Torticollis - likely related to work under his car with spasm from prolonged position in neck flexion at times  Cough, allergic rhinitis - begin OTC antihistamine, can c/t Proair prn  counseled on smoking cessation.  He is not ready to quit    Jackson Arnold was seen today for cpe.  Diagnoses and all orders for this visit:  Encounter for health maintenance examination in adult -     Comprehensive metabolic panel -     CBC -     Lipid panel -     PSA -     Hemoglobin A1c -     Hepatitis C antibody -     EKG 12-Lead  Cough -     CT Chest W Contrast; Future  Tobacco  use  Chronic sinusitis, unspecified location  History of sarcoidosis  Torticollis  Screening for heart disease -     EKG 12-Lead  Need for hepatitis C screening test -     Hepatitis C antibody  Sarcoidosis -  CT Chest W Contrast; Future  Left arm weakness  Vaccine counseling  Other orders -     PROAIR HFA 108 (90 Base) MCG/ACT inhaler; Inhale 2 puffs into the lungs every 6 (six) hours as needed for wheezing or shortness of breath.   Follow-up pending labs, yearly for physical

## 2017-08-14 NOTE — Telephone Encounter (Signed)
Left message on voicemail for patient to call back for appointment info.   August 28, 2017 be there 210 for 230 appointment.  No solid food 4 hours prior to appt.  [facility]

## 2017-08-14 NOTE — Telephone Encounter (Signed)
Pt was aware of appt infor for CT chest

## 2017-08-15 ENCOUNTER — Other Ambulatory Visit: Payer: Self-pay | Admitting: Medical

## 2017-08-15 ENCOUNTER — Encounter: Payer: Self-pay | Admitting: Medical

## 2017-08-15 LAB — COMPREHENSIVE METABOLIC PANEL
A/G RATIO: 1.8 (ref 1.2–2.2)
ALT: 23 IU/L (ref 0–44)
AST: 18 IU/L (ref 0–40)
Albumin: 4.6 g/dL (ref 3.5–5.5)
Alkaline Phosphatase: 97 IU/L (ref 39–117)
BILIRUBIN TOTAL: 0.7 mg/dL (ref 0.0–1.2)
BUN/Creatinine Ratio: 11 (ref 9–20)
BUN: 10 mg/dL (ref 6–24)
CALCIUM: 10.3 mg/dL — AB (ref 8.7–10.2)
CHLORIDE: 102 mmol/L (ref 96–106)
CO2: 25 mmol/L (ref 20–29)
Creatinine, Ser: 0.94 mg/dL (ref 0.76–1.27)
GFR calc Af Amer: 109 mL/min/{1.73_m2} (ref >59)
GFR, EST NON AFRICAN AMERICAN: 94 mL/min/{1.73_m2} (ref >59)
GLUCOSE: 79 mg/dL (ref 65–99)
Globulin, Total: 2.6 g/dL (ref 1.5–4.5)
POTASSIUM: 5.2 mmol/L (ref 3.5–5.2)
Sodium: 142 mmol/L (ref 134–144)
Total Protein: 7.2 g/dL (ref 6.0–8.5)

## 2017-08-15 LAB — HEMOGLOBIN A1C
Est. average glucose Bld gHb Est-mCnc: 108 mg/dL
Hgb A1c MFr Bld: 5.4 % (ref 4.8–5.6)

## 2017-08-15 LAB — CBC
HEMOGLOBIN: 17.6 g/dL (ref 13.0–17.7)
Hematocrit: 50 % (ref 37.5–51.0)
MCH: 30.7 pg (ref 26.6–33.0)
MCHC: 35.2 g/dL (ref 31.5–35.7)
MCV: 87 fL (ref 79–97)
Platelets: 287 10*3/uL (ref 150–450)
RBC: 5.74 x10E6/uL (ref 4.14–5.80)
RDW: 13.1 % (ref 12.3–15.4)
WBC: 12.4 10*3/uL — ABNORMAL HIGH (ref 3.4–10.8)

## 2017-08-15 LAB — LIPID PANEL
CHOLESTEROL TOTAL: 259 mg/dL — AB (ref 100–199)
Chol/HDL Ratio: 8.9 ratio — ABNORMAL HIGH (ref 0.0–5.0)
HDL: 29 mg/dL — ABNORMAL LOW (ref >39)
LDL CALC: 170 mg/dL — AB (ref 0–99)
TRIGLYCERIDES: 301 mg/dL — AB (ref 0–149)
VLDL CHOLESTEROL CAL: 60 mg/dL — AB (ref 5–40)

## 2017-08-15 LAB — HEPATITIS C ANTIBODY: Hep C Virus Ab: <0.1 s/co ratio (ref 0.0–0.9)

## 2017-08-15 LAB — PSA: Prostate Specific Ag, Serum: 0.9 ng/mL (ref 0.0–4.0)

## 2017-08-15 MED ORDER — ATORVASTATIN CALCIUM 20 MG PO TABS: 20.0000 mg | ORAL_TABLET | Freq: Every day | ORAL | 0 refills | Status: DC

## 2017-08-23 ENCOUNTER — Telehealth: Payer: Self-pay | Admitting: Medical

## 2017-08-23 NOTE — Telephone Encounter (Signed)
Received requested records from [facility]. Sending back for review.

## 2017-08-24 ENCOUNTER — Encounter: Payer: Self-pay | Admitting: Medical

## 2017-08-28 ENCOUNTER — Ambulatory Visit
Admission: RE | Admit: 2017-08-28 | Discharge: 2017-08-28 | Disposition: A | Discharge status: Home / Self Care | Payer: BLUE CROSS/BLUE SHIELD | Source: Ambulatory Visit | Attending: Medical | Admitting: Medical

## 2017-08-28 DIAGNOSIS — R05 Cough: Secondary | ICD-10-CM

## 2017-08-28 DIAGNOSIS — D869 Sarcoidosis, unspecified: Secondary | ICD-10-CM

## 2017-08-28 DIAGNOSIS — R059 Cough, unspecified: Secondary | ICD-10-CM

## 2017-08-28 MED ORDER — IOPAMIDOL (ISOVUE-300) INJECTION 61%
75.0000 mL | Freq: Once | INTRAVENOUS | Status: AC | PRN
  Administered 2017-08-28: 75 mL via INTRAVENOUS

## 2017-09-26 ENCOUNTER — Ambulatory Visit: Payer: BLUE CROSS/BLUE SHIELD | Admitting: Medical

## 2017-09-26 ENCOUNTER — Encounter: Payer: Self-pay | Admitting: Medical

## 2017-09-26 VITALS — BP 120/82 | HR 70 | Temp 98.6°F | Ht 72.0 in | Wt 169.8 lb

## 2017-09-26 DIAGNOSIS — E785 Hyperlipidemia, unspecified: Secondary | ICD-10-CM

## 2017-09-26 DIAGNOSIS — Z87891 Personal history of nicotine dependence: Secondary | ICD-10-CM | POA: Diagnosis not present

## 2017-09-26 DIAGNOSIS — D72829 Elevated white blood cell count, unspecified: Secondary | ICD-10-CM

## 2017-09-26 DIAGNOSIS — G4700 Insomnia, unspecified: Secondary | ICD-10-CM | POA: Insufficient documentation

## 2017-09-26 LAB — LIPID PANEL
CHOL/HDL RATIO: 4.1 ratio (ref 0.0–5.0)
Cholesterol, Total: 153 mg/dL (ref 100–199)
HDL: 37 mg/dL — ABNORMAL LOW (ref >39)
LDL Calculated: 98 mg/dL (ref 0–99)
TRIGLYCERIDES: 90 mg/dL (ref 0–149)
VLDL Cholesterol Cal: 18 mg/dL (ref 5–40)

## 2017-09-26 LAB — CBC WITH DIFFERENTIAL/PLATELET
BASOS ABS: 0 10*3/uL (ref 0.0–0.2)
BASOS: 1 %
EOS (ABSOLUTE): 0.2 10*3/uL (ref 0.0–0.4)
Eos: 2 %
HEMOGLOBIN: 17 g/dL (ref 13.0–17.7)
Hematocrit: 47.2 % (ref 37.5–51.0)
Immature Grans (Abs): 0 10*3/uL (ref 0.0–0.1)
Immature Granulocytes: 0 %
Lymphocytes Absolute: 2.7 10*3/uL (ref 0.7–3.1)
Lymphs: 33 %
MCH: 30.8 pg (ref 26.6–33.0)
MCHC: 36 g/dL — ABNORMAL HIGH (ref 31.5–35.7)
MCV: 86 fL (ref 79–97)
Monocytes Absolute: 0.7 10*3/uL (ref 0.1–0.9)
Monocytes: 8 %
NEUTROS ABS: 4.7 10*3/uL (ref 1.4–7.0)
Neutrophils: 56 %
Platelets: 265 10*3/uL (ref 150–450)
RBC: 5.52 x10E6/uL (ref 4.14–5.80)
RDW: 13.1 % (ref 12.3–15.4)
WBC: 8.3 10*3/uL (ref 3.4–10.8)

## 2017-09-26 MED ORDER — ZOLPIDEM TARTRATE 10 MG PO TABS: 10.0000 mg | ORAL_TABLET | Freq: Every evening | ORAL | 0 refills | Status: DC | PRN

## 2017-09-26 NOTE — Progress Notes (Signed)
Subjective: Chief Complaint  Patient presents with  . Follow-up    cholesterol recheck    Here for med check.   I saw him back in July for a physical.  At that time his cholesterol levels were elevated, his white blood cells are elevated as well. He reports a history of hyperlipidemia with lipids in the 700s before.  At his last visit we recommended he quit smoking and he actually quit smoking on August 19, 2017.  Drinks 3 x 32oz of coconut waters daily  He is compliant with Lipitor started last visit.  His white blood cell count was elevated last visit.  He denies prior elevated white count being elevated.  No recent fevers no weight loss.  He has been having some trouble sleeping.  He is on been able to sleep about 4 hours per night lately.  He has been dealing with his mother's illness.  Had to move her in and out of a nursing home recently had to get hospice involved.  Mother passed with lymphoma last month Wants some help with sleep.  In the past has tried sleep aid a few times.  Reports that he is calcium has been elevated in the past long-standing  He continues to have some myalgias that he was describing last visit but they have improved some.  Past Medical History:  Diagnosis Date  . Bronchitis   . Chest pain 06/2011   ED/observation visit  . H/O CT scan 06/2011   cardiac, normal  . History of EKG 5/13   sinus bradycardia  . Hyperlipidemia    tricor prior  . Nausea and vomiting after administration of anesthetic agent   . 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 Tobacco use    Current Outpatient Medications on File Prior to Visit  Medication Sig Dispense Refill  . albuterol (PROVENTIL HFA;VENTOLIN HFA) 108 (90 BASE) MCG/ACT inhaler Inhale 2 puffs into the lungs every 6 (six) hours as needed for wheezing.    Marland Kitchen atorvastatin (LIPITOR) 20 MG tablet Take 1 tablet (20 mg total) by mouth daily. 90 tablet 0  . ibuprofen (ADVIL,MOTRIN) 200 MG tablet Take 600 mg by mouth every 6 (six)  hours as needed. For pain    . PROAIR HFA 108 (90 Base) MCG/ACT inhaler Inhale 2 puffs into the lungs every 6 (six) hours as needed for wheezing or shortness of breath. 18 g 1   Current Facility-Administered Medications on File Prior to Visit  Medication Dose Route Frequency Provider Last Rate Last Dose  . 0.9 %  sodium chloride infusion  500 mL Intravenous Once Armbruster, Carlota Raspberry, MD       ROS as in subjective   Objective: BP 120/82   Pulse 70   Temp 98.6 F (37 C) (Oral)   Ht 6' (1.829 m)   Wt 169 lb 12.8 oz (77 kg)   SpO2 98%   BMI 23.03 kg/m   Wt Readings from Last 3 Encounters:  09/26/17 169 lb 12.8 oz (77 kg)  08/14/17 165 lb 9.6 oz (75.1 kg)  05/15/17 174 lb (78.9 kg)   General appearance: alert, no distress, WD/WN,  Neck: supple, no lymphadenopathy, no thyromegaly, no masses Heart: RRR, normal S1, S2, no murmurs Lungs: CTA bilaterally, no wheezes, rhonchi, or rales Arms and legs nontender, no swelling or deformity Pulses: 2+ symmetric, upper and lower extremities, normal cap refill      Assessment: Encounter Diagnoses  Name Primary?  . Leukocytosis, unspecified type Yes  . Hyperlipidemia,  unspecified hyperlipidemia type   . Former smoker   . Insomnia, unspecified type   . Dyslipidemia      Plan: Leukocytosis-discussed differential, repeat lab today, he just recently stopped smoking.  Leukocytosis could be related to chronic inflammation  Hyperlipidemia-lab today fasting, he started Lipitor 6 weeks ago.  He reports eating a healthy diet and exercising regularly.  He notes long-standing history of dyslipidemia  Congratulated him on his efforts at stopping smoking  Insomnia-transient.  I expressed sympathy for his recent loss of his mother.  Begin short-term zolpidem as needed.  Discussed risk and benefits and proper use of medicine.   Kahmari was seen today for follow-up.  Diagnoses and all orders for this visit:  Leukocytosis, unspecified type -      CBC with Differential/Platelet  Hyperlipidemia, unspecified hyperlipidemia type -     Lipid panel  Former smoker  Insomnia, unspecified type  Dyslipidemia  Other orders -     zolpidem (AMBIEN) 10 MG tablet; Take 1 tablet (10 mg total) by mouth at bedtime as needed for sleep.

## 2017-09-27 ENCOUNTER — Other Ambulatory Visit: Payer: Self-pay | Admitting: Medical

## 2017-09-27 MED ORDER — ATORVASTATIN CALCIUM 20 MG PO TABS: 20.0000 mg | ORAL_TABLET | Freq: Every day | ORAL | 3 refills | Status: DC

## 2017-11-22 ENCOUNTER — Other Ambulatory Visit: Payer: Self-pay | Admitting: Medical

## 2017-11-22 MED ORDER — AMOXICILLIN 500 MG PO TABS
ORAL_TABLET | ORAL | 0 refills | Status: DC
Start: 2017-11-22 — End: 2017-12-27

## 2017-12-07 ENCOUNTER — Telehealth: Payer: Self-pay

## 2017-12-07 DIAGNOSIS — J019 Acute sinusitis, unspecified: Secondary | ICD-10-CM | POA: Diagnosis not present

## 2017-12-07 DIAGNOSIS — R05 Cough: Secondary | ICD-10-CM | POA: Diagnosis not present

## 2017-12-07 NOTE — Telephone Encounter (Signed)
Pt called and says you gave him a abx and he has finished it three days ago and still is feeling congested. Pt would like to know if there is anything he can do to get better.

## 2017-12-07 NOTE — Telephone Encounter (Signed)
Needs appt

## 2017-12-08 NOTE — Telephone Encounter (Signed)
Called and left voice mail for pt

## 2017-12-17 ENCOUNTER — Encounter (HOSPITAL_COMMUNITY): Payer: Self-pay

## 2017-12-17 ENCOUNTER — Emergency Department (HOSPITAL_COMMUNITY)
Admission: EM | Admit: 2017-12-17 | Discharge: 2017-12-17 | Disposition: A | Discharge status: Home / Self Care | Payer: BLUE CROSS/BLUE SHIELD | Attending: Emergency Medicine | Admitting: Emergency Medicine

## 2017-12-17 ENCOUNTER — Emergency Department (HOSPITAL_COMMUNITY): Payer: BLUE CROSS/BLUE SHIELD

## 2017-12-17 DIAGNOSIS — Z87891 Personal history of nicotine dependence: Secondary | ICD-10-CM | POA: Insufficient documentation

## 2017-12-17 DIAGNOSIS — R0981 Nasal congestion: Secondary | ICD-10-CM | POA: Diagnosis not present

## 2017-12-17 DIAGNOSIS — Z79899 Other long term (current) drug therapy: Secondary | ICD-10-CM | POA: Insufficient documentation

## 2017-12-17 DIAGNOSIS — J069 Acute upper respiratory infection, unspecified: Secondary | ICD-10-CM

## 2017-12-17 DIAGNOSIS — R05 Cough: Secondary | ICD-10-CM | POA: Diagnosis not present

## 2017-12-17 LAB — INFLUENZA PANEL BY PCR (TYPE A & B)
Influenza A By PCR: NEGATIVE
Influenza B By PCR: NEGATIVE

## 2017-12-17 MED ORDER — BENZONATATE 100 MG PO CAPS: 200.0000 mg | ORAL_CAPSULE | Freq: Three times a day (TID) | ORAL | 0 refills | Status: DC

## 2017-12-17 MED ORDER — IPRATROPIUM BROMIDE 0.03 % NA SOLN: 2.0000 | Freq: Two times a day (BID) | NASAL | 12 refills | Status: DC

## 2017-12-17 NOTE — Discharge Instructions (Addendum)
Coricidin HBP as needed as directed for symptom relief. Tessalon as needed for cough. Try Atrovent nasal spray twice daily for 3 days for nasal congestion. Do NOT use longer than 3 days. Follow up with ENT, referral given.   Your chest x-ray was normal- no signs of pneumonia or chest infection. Flu swab is negative for the flu today.

## 2017-12-17 NOTE — ED Provider Notes (Signed)
[facility] DEPT Provider Note   CSN: [id number] Arrival date & time: 12/17/17  1929     History   Chief Complaint Chief Complaint  Patient presents with  . URI    HPI Jackson Arnold is a 51 y.o. male.  52 year old male presents with complaint of sinus congestion and cough.  Patient states his symptoms started at the beginning of October, notes he had sinus congestion with yellow and green nasal drainage, called his PCP who called in amoxicillin 1 g twice daily x10 days.  Patient completed the 10-day course of amoxicillin, did not feel like he was getting any better and went to urgent care on October 24 where he was diagnosed with sinusitis and prescribed Augmentin x14 days with prednisone x10 days.  Patient states that he is also doing saline sinus rinse and unable to use Flonase due to his congestion.  Patient states that he feels like he is improving however remains congested in his sinuses, postnasal drip, nonproductive cough.  Patient denies wheezing, shortness of breath, difficulty breathing, fevers, chills.  Patient states that he woke up today with an ache in his left hip, denies any trauma or injury and believes he may have the flu and is requesting Tamiflu.  No known sick contacts.  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  Patient denies previous sinus history while living in [place], states since moving to [place] several years ago he has had constant sinus problems.  Patient is a former smoker, quit smoking 4 months ago.  No other complaints or concerns.     Past Medical History:  Diagnosis Date  . Bronchitis   . Chest pain 06/2011   ED/observation visit  . H/O CT scan 06/2011   cardiac, normal  . History of EKG 5/13   sinus bradycardia  . Hyperlipidemia    tricor prior  . Nausea and vomiting after administration of anesthetic agent   . 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 Tobacco use     Patient Active  Problem List   Diagnosis Date Noted  . Leukocytosis 09/26/2017  . Hyperlipidemia 09/26/2017  . Former smoker 09/26/2017  . Insomnia 09/26/2017  . Dyslipidemia 09/26/2017  . Cough 08/14/2017  . Tobacco use 08/14/2017  . Chronic sinusitis 08/14/2017  . History of sarcoidosis 08/14/2017  . Torticollis 08/14/2017  . Need for hepatitis C screening test 08/14/2017  . Sarcoidosis 08/14/2017  . Left arm weakness 08/14/2017  . Vaccine counseling 08/14/2017  . Encounter for health maintenance examination in adult 04/20/2017  . Pruritus ani 03/15/2016    Past Surgical History:  Procedure Laterality Date  . COLONOSCOPY  05/2017   polyps, repeat 2024; Dr. Havery Moros  . LUNG BIOPSY  1998  . TONSILLECTOMY          Home Medications    Prior to Admission medications   Medication Sig Start Date End Date Taking? Authorizing Provider  albuterol (PROVENTIL HFA;VENTOLIN HFA) 108 (90 BASE) MCG/ACT inhaler Inhale 2 puffs into the lungs every 6 (six) hours as needed for wheezing.    [provider]  amoxicillin (AMOXIL) 500 MG tablet 2 tablets po BID x 10 days 11/22/17   Tysinger, Camelia Eng, PA-C  atorvastatin (LIPITOR) 20 MG tablet Take 1 tablet (20 mg total) by mouth daily. 09/27/17 09/27/18  Tysinger, Camelia Eng, PA-C  benzonatate (TESSALON) 100 MG capsule Take 2 capsules (200 mg total) by mouth every 8 (eight) hours. 12/17/17   Tacy Learn, PA-C  ibuprofen (ADVIL,MOTRIN) 200 MG tablet Take 600 mg by mouth every 6 (six) hours as needed. For pain    [provider]  ipratropium (ATROVENT) 0.03 % nasal spray Place 2 sprays into both nostrils every 12 (twelve) hours for 3 days. 12/17/17 12/20/17  Tacy Learn, PA-C  PROAIR HFA 108 [id number] Base) MCG/ACT inhaler Inhale 2 puffs into the lungs every 6 (six) hours as needed for wheezing or shortness of breath. 08/14/17   Tysinger, Camelia Eng, PA-C  zolpidem (AMBIEN) 10 MG tablet Take 1 tablet (10 mg total) by mouth at bedtime as needed for sleep.  09/26/17 10/26/17  Tysinger, Camelia Eng, PA-C    Family History Family History  Problem Relation Age of Onset  . Diabetes Mother   . Cancer Mother        skin, lymphoma  . Lung cancer Father   . Cancer Maternal Aunt        colon  . Colon cancer Maternal Aunt   . Stroke Paternal Grandmother   . Hypertension Neg Hx   . Esophageal cancer Neg Hx   . Liver cancer Neg Hx   . Pancreatic cancer Neg Hx   . Prostate cancer Neg Hx   . Rectal cancer Neg Hx   . Stomach cancer Neg Hx     Social History Social History   Tobacco Use  . Smoking status: Former Smoker    Packs/day: 1.00    Years: 30.00    Pack years: 30.00    Types: Cigarettes    Start date: 08/19/2017  . Smokeless tobacco: Never Used  Substance Use Topics  . Alcohol use: No  . Drug use: No     Allergies   Tramadol   Review of Systems Review of Systems  Constitutional: Negative for chills and fever.  HENT: Positive for congestion, postnasal drip, sinus pressure and sore throat. Negative for ear pain, rhinorrhea and sneezing.   Respiratory: Positive for cough. Negative for shortness of breath and wheezing.   Musculoskeletal: Positive for arthralgias. Negative for gait problem.  Skin: Negative for rash and wound.  Allergic/Immunologic: Negative for immunocompromised state.  Neurological: Negative for weakness.  Hematological: Negative for adenopathy.  Psychiatric/Behavioral: Negative for confusion.  All other systems reviewed and are negative.    Physical Exam Updated Vital Signs BP 134/90 (BP Location: Left Arm)   Pulse 100   Temp 98.4 F (36.9 C) (Oral)   Resp 18   Ht 5\' 11"  (1.803 m)   Wt 79.4 kg   SpO2 100%   BMI 24.41 kg/m   Physical Exam  Constitutional: He is oriented to person, place, and time. He appears well-developed and well-nourished. No distress.  HENT:  Head: Normocephalic and atraumatic.  Right Ear: External ear normal.  Left Ear: External ear normal.  Nose: Mucosal edema present. No  sinus tenderness. Right sinus exhibits no maxillary sinus tenderness and no frontal sinus tenderness. Left sinus exhibits no maxillary sinus tenderness and no frontal sinus tenderness.  Mouth/Throat: Uvula is midline. No trismus in the jaw. No uvula swelling. Posterior oropharyngeal erythema present. No oropharyngeal exudate, posterior oropharyngeal edema or tonsillar abscesses.  Eyes: Conjunctivae are normal.  Neck: Neck supple.  Cardiovascular: Normal rate, regular rhythm, normal heart sounds and intact distal pulses.  No murmur heard. Pulmonary/Chest: Effort normal and breath sounds normal. No respiratory distress.  Lymphadenopathy:    He has no cervical adenopathy.  Neurological: He is alert and oriented to person, place, and time. No sensory deficit.  Skin: Skin is warm and dry. He is not diaphoretic.  Psychiatric: He has a normal mood and affect. His behavior is normal.  Nursing note and vitals reviewed.    ED Treatments / Results  Labs (all labs ordered are listed, but only abnormal results are displayed) Labs Reviewed  INFLUENZA PANEL BY PCR (TYPE A & B)    EKG None  Radiology Dg Chest 2 View  Result Date: 12/17/2017 CLINICAL DATA:  History of sinus infection and cough EXAM: CHEST - 2 VIEW COMPARISON:  08/28/2017 FINDINGS: The heart size and mediastinal contours are within normal limits. Both lungs are clear. The visualized skeletal structures are unremarkable. IMPRESSION: No active cardiopulmonary disease. Electronically Signed   By: Inez Catalina M.D.   On: 12/17/2017 21:01    Procedures Procedures (including critical care time)  Medications Ordered in ED Medications - No data to display   Initial Impression / Assessment and Plan / ED Course  I have reviewed the triage vital signs and the nursing notes.  Pertinent labs & imaging results that were available during my care of the patient were reviewed by me and considered in my medical decision making (see chart for  details).  Clinical Course as of Dec 18 2210  Nancy Fetter Dec 18, 9514  [id number] 52 year old male with complaint of ongoing sinus congestion, nonproductive cough, now with ache in his left hip without injury.  Vision is well-appearing, currently on Augmentin.  Completed amoxicillin earlier in the month without relief of his sinus symptoms.  Patient is not using Flonase as he states he is too congested to be able to use it.  Patient is concerned that the ache in his hip may be due to now having flu.  Patient's flu swab is negative, no other symptoms suggesting flu.  His chest x-ray is clear, no evidence of bronchitis, COPD, pneumonia.  He also does completed a course of steroids.  Recommend that patient use Atrovent nasal spray for no more than 3 days, given Tessalon for cough and referred to ENT.   [LM]    Clinical Course User Index [LM] Tacy Learn, PA-C   Final Clinical Impressions(s) / ED Diagnoses   Final diagnoses:  Viral upper respiratory tract infection    ED Discharge Orders         Ordered    benzonatate (TESSALON) 100 MG capsule  Every 8 hours     12/17/17 2158    ipratropium (ATROVENT) 0.03 % nasal spray  Every 12 hours     12/17/17 2158           Tacy Learn, PA-C 12/17/17 2212    Valarie Merino, MD 12/18/17 1456

## 2017-12-17 NOTE — ED Triage Notes (Signed)
Pt reports that he has sinus infection starting the beginning of oct. And has been through courses of ABX therapy  And steroids. Pt states that he continues to have chest congestion and feels as if his symptoms are worsening. Pt c/o sore throat, nasal congestion, malaise.

## 2017-12-20 DIAGNOSIS — J342 Deviated nasal septum: Secondary | ICD-10-CM | POA: Diagnosis not present

## 2017-12-20 DIAGNOSIS — J310 Chronic rhinitis: Secondary | ICD-10-CM | POA: Diagnosis not present

## 2017-12-20 DIAGNOSIS — J338 Other polyp of sinus: Secondary | ICD-10-CM | POA: Diagnosis not present

## 2017-12-20 DIAGNOSIS — J343 Hypertrophy of nasal turbinates: Secondary | ICD-10-CM | POA: Diagnosis not present

## 2017-12-27 ENCOUNTER — Ambulatory Visit: Payer: BLUE CROSS/BLUE SHIELD | Admitting: Medical

## 2017-12-27 ENCOUNTER — Encounter: Payer: Self-pay | Admitting: Medical

## 2017-12-27 ENCOUNTER — Other Ambulatory Visit (INDEPENDENT_AMBULATORY_CARE_PROVIDER_SITE_OTHER): Payer: Self-pay | Admitting: Otolaryngology

## 2017-12-27 VITALS — BP 128/80 | HR 84 | Temp 98.4°F | Resp 16 | Ht 71.0 in | Wt 177.8 lb

## 2017-12-27 DIAGNOSIS — J342 Deviated nasal septum: Secondary | ICD-10-CM | POA: Diagnosis not present

## 2017-12-27 DIAGNOSIS — J329 Chronic sinusitis, unspecified: Secondary | ICD-10-CM

## 2017-12-27 DIAGNOSIS — R059 Cough, unspecified: Secondary | ICD-10-CM

## 2017-12-27 DIAGNOSIS — R05 Cough: Secondary | ICD-10-CM

## 2017-12-27 DIAGNOSIS — J343 Hypertrophy of nasal turbinates: Secondary | ICD-10-CM | POA: Insufficient documentation

## 2017-12-27 DIAGNOSIS — R0982 Postnasal drip: Secondary | ICD-10-CM | POA: Diagnosis not present

## 2017-12-27 MED ORDER — HYDROCODONE-HOMATROPINE 5-1.5 MG/5ML PO SYRP: 5.0000 mL | ORAL_SOLUTION | Freq: Three times a day (TID) | ORAL | 0 refills | Status: AC | PRN

## 2017-12-27 NOTE — Progress Notes (Signed)
Subjective: Chief Complaint  Patient presents with  . sinus    cough, post nasel drip, congestion X 1-2 month   Here for 1 to 13-month history of ongoing sinus congestion, postnasal drip, cough.  He first initiated contact with me about the symptoms through email several weeks ago, and I reluctantly sent out antibiotic to help with sinus infection symptoms with the caveat to return if not resolved within a week.  He ended up instead going to [facility] urgent care about a week later, was prescribed Augmentin and Prednisone, ended up having [employer] called out.  He still was not improving so he ended up seeing ear nose and throat specialist.  Saw Dr. Benjamine Mola ENT, has severely deviated leftward septum, polyps in right nare.   He is scheduled for CT sinuses, but is awaiting approval.  Currently he is using Flonase twice daily per ENT, using Afrin some, using Allegra here and there, Tessalon Perles daily but the [employer] is not helping much  Quit tobacco 08/19/17.   Past Medical History:  Diagnosis Date  . Bronchitis   . Chest pain 06/2011   ED/observation visit  . H/O CT scan 06/2011   cardiac, normal  . History of EKG 5/13   sinus bradycardia  . Hyperlipidemia    tricor prior  . Nausea and vomiting after administration of anesthetic agent   . 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 Tobacco use    Current Outpatient Medications on File Prior to Visit  Medication Sig Dispense Refill  . atorvastatin (LIPITOR) 20 MG tablet Take 1 tablet (20 mg total) by mouth daily. 90 tablet 3  . fexofenadine (ALLEGRA) 180 MG tablet Take 180 mg by mouth daily.    Marland Kitchen ibuprofen (ADVIL,MOTRIN) 200 MG tablet Take 600 mg by mouth every 6 (six) hours as needed. For pain    . PROAIR HFA 108 (90 Base) MCG/ACT inhaler Inhale 2 puffs into the lungs every 6 (six) hours as needed for wheezing or shortness of breath. 18 g 1  . albuterol (PROVENTIL HFA;VENTOLIN HFA) 108 (90 BASE) MCG/ACT inhaler Inhale 2 puffs into  the lungs every 6 (six) hours as needed for wheezing.    . benzonatate (TESSALON) 100 MG capsule Take 2 capsules (200 mg total) by mouth every 8 (eight) hours. 21 capsule 0  . ipratropium (ATROVENT) 0.03 % nasal spray Place 2 sprays into both nostrils every 12 (twelve) hours for 3 days. 30 mL 12  . zolpidem (AMBIEN) 10 MG tablet Take 1 tablet (10 mg total) by mouth at bedtime as needed for sleep. 20 tablet 0   Current Facility-Administered Medications on File Prior to Visit  Medication Dose Route Frequency Provider Last Rate Last Dose  . 0.9 %  sodium chloride infusion  500 mL Intravenous Once Armbruster, Carlota Raspberry, MD       ROS as in subjective   Objective: BP 128/80   Pulse 84   Temp 98.4 F (36.9 C) (Oral)   Resp 16   Ht 5\' 11"  (1.803 m)   Wt 177 lb 12.8 oz (80.6 kg)   SpO2 97%   BMI 24.80 kg/m   General appearance: alert, no distress, WD/WN,  HEENT: normocephalic, sclerae anicteric, TMs pearly, nares with turbinate edema, mucoid discharge,+ erythema, pharynx normal Oral cavity: MMM, no lesions Neck: supple, no lymphadenopathy, no thyromegaly, no masses Lungs: CTA bilaterally, no wheezes, rhonchi, or rales    Assessment: Encounter Diagnoses  Name Primary?  . Nasal turbinate hypertrophy Yes  .  Deviated septum   . Chronic sinusitis, unspecified location   . Post-nasal drip   . Cough     Plan:  Discussed symptoms, concerns, and reocmmendations below.  Recommendations  Continue Allegra at bedtime daily for the time being  In the evening or an hour before bedtime, do a nasal saline flush  Follow this up with salt water gargles to clear out mucus from her throat  Right before bedtime usual Flonase nasal spray  You can use Hycodan cough syrup short-term as needed  Follow-up for CT sinus and follow-up with Dr. Benjamine Mola as planned  In the future try to come here for this type of issue.   Seeing urgent care and then ENT removes [place] from the process and causes some  fragmented care if ENT doesn't know we are your primary care provider.   I am happy that you quit tobacco which will ultimately improve your overall health and prevent certain disease processes. Kalep was seen today for sinus.  Diagnoses and all orders for this visit:  Nasal turbinate hypertrophy  Deviated septum  Chronic sinusitis, unspecified location  Post-nasal drip  Cough  Other orders -     HYDROcodone-homatropine (HYCODAN) 5-1.5 MG/5ML syrup; Take 5 mLs by mouth every 8 (eight) hours as needed for up to 5 days for cough.

## 2017-12-27 NOTE — Patient Instructions (Addendum)
Recommendations  Continue Allegra at bedtime daily for the time being  In the evening or an hour before bedtime, do a nasal saline flush  Follow this up with salt water gargles to clear out mucus from her throat  Right before bedtime usual Flonase nasal spray  You can use Hycodan cough syrup short-term as needed  Follow-up for CT sinus and follow-up with Dr. Benjamine Mola as planned  In the future try to come here for this type of issue.   Seeing urgent care and then ENT removes [place] from the process and causes some fragmented care if ENT doesn't know we are your primary care provider.   I am happy that you quit tobacco which will ultimately improve your overall health and prevent certain disease processes.    Using Saline Nose Drops with Bulb Syringe A bulb syringe is used to clear your nose. You may use it when you have a stuffy nose, nasal congestion, sinus pressure, or sneezing.   SALINE SOLUTION You can buy nose drops at your local drug store. You can also make nose drops yourself. Mix 1 cup of water with  teaspoon of salt. Stir. Store this mixture at room temperature. Make a new batch daily.  USE THE BULB IN COMBINATION WITH SALINE NOSE DROPS  Squeeze the air out of the bulb before suctioning the saline mixture.  While still squeezing the bulb flat, place the tip of the bulb into the saline mixture.  Let air come back into the bulb.  This will suction up the saline mixture.  Gently flush one nostril at a time.  Salt water nose drops will then moisten your  congested nose and loosen secretions before suctioning.  Use the bulb syringe as directed below to suction.  USING THE BULB SYRINGE TO SUCTION  While still squeezing the bulb flat, place the tip of the bulb into a nostril. Let air come back into the bulb. The suction will pull snot out of the nose and into the bulb.  Repeat on the other nostril.  Squeeze syringe several times into a tissue.  CLEANING THE BULB  SYRINGE  Clean the bulb syringe every day with hot soapy water.  Clean the inside of the bulb by squeezing the bulb while the tip is in soapy water.  Rinse by squeezing the bulb while the tip is in clean hot water.  Store the bulb with the tip side down on paper towel.  HOME CARE INSTRUCTIONS   Use saline nose drops often to keep the nose open and not stuffy.  Throw away used salt water. Make a new solution every time.  Do not use the same solution and dropper for another person  If you do not prefer to use nasal saline flush, other options include nasal saline spray or the [employer], both of which are available over the counter at your pharmacy.

## 2018-01-02 ENCOUNTER — Ambulatory Visit
Admission: RE | Admit: 2018-01-02 | Discharge: 2018-01-02 | Disposition: A | Discharge status: Home / Self Care | Payer: BLUE CROSS/BLUE SHIELD | Source: Ambulatory Visit | Attending: Otolaryngology | Admitting: Otolaryngology

## 2018-01-02 DIAGNOSIS — J329 Chronic sinusitis, unspecified: Secondary | ICD-10-CM

## 2018-01-02 DIAGNOSIS — J322 Chronic ethmoidal sinusitis: Secondary | ICD-10-CM | POA: Diagnosis not present

## 2018-01-09 DIAGNOSIS — J322 Chronic ethmoidal sinusitis: Secondary | ICD-10-CM | POA: Diagnosis not present

## 2018-01-09 DIAGNOSIS — J321 Chronic frontal sinusitis: Secondary | ICD-10-CM | POA: Diagnosis not present

## 2018-01-09 DIAGNOSIS — J343 Hypertrophy of nasal turbinates: Secondary | ICD-10-CM | POA: Diagnosis not present

## 2018-01-09 DIAGNOSIS — J320 Chronic maxillary sinusitis: Secondary | ICD-10-CM | POA: Diagnosis not present

## 2018-01-30 DIAGNOSIS — J320 Chronic maxillary sinusitis: Secondary | ICD-10-CM | POA: Diagnosis not present

## 2018-01-30 DIAGNOSIS — J322 Chronic ethmoidal sinusitis: Secondary | ICD-10-CM | POA: Diagnosis not present

## 2018-01-30 DIAGNOSIS — J321 Chronic frontal sinusitis: Secondary | ICD-10-CM | POA: Diagnosis not present

## 2018-01-30 DIAGNOSIS — J343 Hypertrophy of nasal turbinates: Secondary | ICD-10-CM | POA: Diagnosis not present

## 2018-02-09 ENCOUNTER — Other Ambulatory Visit (INDEPENDENT_AMBULATORY_CARE_PROVIDER_SITE_OTHER): Payer: Self-pay | Admitting: Otolaryngology

## 2018-02-09 DIAGNOSIS — J343 Hypertrophy of nasal turbinates: Secondary | ICD-10-CM | POA: Diagnosis not present

## 2018-02-09 DIAGNOSIS — J324 Chronic pansinusitis: Secondary | ICD-10-CM | POA: Diagnosis not present

## 2018-02-09 DIAGNOSIS — J321 Chronic frontal sinusitis: Secondary | ICD-10-CM | POA: Diagnosis not present

## 2018-02-09 DIAGNOSIS — J3489 Other specified disorders of nose and nasal sinuses: Secondary | ICD-10-CM | POA: Diagnosis not present

## 2018-02-09 DIAGNOSIS — J320 Chronic maxillary sinusitis: Secondary | ICD-10-CM | POA: Diagnosis not present

## 2018-02-09 DIAGNOSIS — J322 Chronic ethmoidal sinusitis: Secondary | ICD-10-CM | POA: Diagnosis not present

## 2018-02-09 DIAGNOSIS — J329 Chronic sinusitis, unspecified: Secondary | ICD-10-CM | POA: Diagnosis not present

## 2018-02-09 DIAGNOSIS — J338 Other polyp of sinus: Secondary | ICD-10-CM | POA: Diagnosis not present

## 2018-02-16 ENCOUNTER — Encounter: Payer: Self-pay | Admitting: Medical

## 2018-02-16 DIAGNOSIS — J320 Chronic maxillary sinusitis: Secondary | ICD-10-CM | POA: Diagnosis not present

## 2018-02-16 DIAGNOSIS — J322 Chronic ethmoidal sinusitis: Secondary | ICD-10-CM | POA: Diagnosis not present

## 2018-02-16 DIAGNOSIS — J338 Other polyp of sinus: Secondary | ICD-10-CM | POA: Diagnosis not present

## 2018-02-16 DIAGNOSIS — J321 Chronic frontal sinusitis: Secondary | ICD-10-CM | POA: Diagnosis not present

## 2018-03-06 DIAGNOSIS — J322 Chronic ethmoidal sinusitis: Secondary | ICD-10-CM | POA: Diagnosis not present

## 2018-03-06 DIAGNOSIS — J320 Chronic maxillary sinusitis: Secondary | ICD-10-CM | POA: Diagnosis not present

## 2018-03-06 DIAGNOSIS — J321 Chronic frontal sinusitis: Secondary | ICD-10-CM | POA: Diagnosis not present

## 2018-03-06 DIAGNOSIS — J338 Other polyp of sinus: Secondary | ICD-10-CM | POA: Diagnosis not present

## 2018-03-07 ENCOUNTER — Encounter: Payer: Self-pay | Admitting: Medical

## 2018-03-20 DIAGNOSIS — J322 Chronic ethmoidal sinusitis: Secondary | ICD-10-CM | POA: Diagnosis not present

## 2018-03-20 DIAGNOSIS — J320 Chronic maxillary sinusitis: Secondary | ICD-10-CM | POA: Diagnosis not present

## 2018-03-20 DIAGNOSIS — J321 Chronic frontal sinusitis: Secondary | ICD-10-CM | POA: Diagnosis not present

## 2018-04-17 DIAGNOSIS — J322 Chronic ethmoidal sinusitis: Secondary | ICD-10-CM | POA: Diagnosis not present

## 2018-04-17 DIAGNOSIS — J320 Chronic maxillary sinusitis: Secondary | ICD-10-CM | POA: Diagnosis not present

## 2018-04-17 DIAGNOSIS — J321 Chronic frontal sinusitis: Secondary | ICD-10-CM | POA: Diagnosis not present

## 2018-04-29 DIAGNOSIS — J069 Acute upper respiratory infection, unspecified: Secondary | ICD-10-CM | POA: Diagnosis not present

## 2018-08-21 DIAGNOSIS — J322 Chronic ethmoidal sinusitis: Secondary | ICD-10-CM | POA: Diagnosis not present

## 2018-08-21 DIAGNOSIS — J321 Chronic frontal sinusitis: Secondary | ICD-10-CM | POA: Diagnosis not present

## 2018-08-21 DIAGNOSIS — J343 Hypertrophy of nasal turbinates: Secondary | ICD-10-CM | POA: Diagnosis not present

## 2018-08-21 DIAGNOSIS — J320 Chronic maxillary sinusitis: Secondary | ICD-10-CM | POA: Diagnosis not present

## 2018-09-03 ENCOUNTER — Encounter: Payer: Self-pay | Admitting: Medical

## 2018-10-05 DIAGNOSIS — Z1159 Encounter for screening for other viral diseases: Secondary | ICD-10-CM | POA: Diagnosis not present

## 2018-10-11 ENCOUNTER — Other Ambulatory Visit (INDEPENDENT_AMBULATORY_CARE_PROVIDER_SITE_OTHER): Payer: Self-pay | Admitting: Otolaryngology

## 2018-10-11 DIAGNOSIS — J320 Chronic maxillary sinusitis: Secondary | ICD-10-CM | POA: Diagnosis not present

## 2018-10-11 DIAGNOSIS — J321 Chronic frontal sinusitis: Secondary | ICD-10-CM | POA: Diagnosis not present

## 2018-10-11 DIAGNOSIS — J343 Hypertrophy of nasal turbinates: Secondary | ICD-10-CM | POA: Diagnosis not present

## 2018-10-11 DIAGNOSIS — J322 Chronic ethmoidal sinusitis: Secondary | ICD-10-CM | POA: Diagnosis not present

## 2018-10-11 DIAGNOSIS — J3489 Other specified disorders of nose and nasal sinuses: Secondary | ICD-10-CM | POA: Diagnosis not present

## 2018-10-11 DIAGNOSIS — J338 Other polyp of sinus: Secondary | ICD-10-CM | POA: Diagnosis not present

## 2018-10-11 DIAGNOSIS — J329 Chronic sinusitis, unspecified: Secondary | ICD-10-CM | POA: Diagnosis not present

## 2018-10-19 DIAGNOSIS — J321 Chronic frontal sinusitis: Secondary | ICD-10-CM | POA: Diagnosis not present

## 2018-10-19 DIAGNOSIS — J322 Chronic ethmoidal sinusitis: Secondary | ICD-10-CM | POA: Diagnosis not present

## 2018-10-19 DIAGNOSIS — J338 Other polyp of sinus: Secondary | ICD-10-CM | POA: Diagnosis not present

## 2018-10-19 DIAGNOSIS — J320 Chronic maxillary sinusitis: Secondary | ICD-10-CM | POA: Diagnosis not present

## 2018-11-02 DIAGNOSIS — J322 Chronic ethmoidal sinusitis: Secondary | ICD-10-CM | POA: Diagnosis not present

## 2018-11-02 DIAGNOSIS — J321 Chronic frontal sinusitis: Secondary | ICD-10-CM | POA: Diagnosis not present

## 2018-11-02 DIAGNOSIS — J338 Other polyp of sinus: Secondary | ICD-10-CM | POA: Diagnosis not present

## 2018-11-02 DIAGNOSIS — J320 Chronic maxillary sinusitis: Secondary | ICD-10-CM | POA: Diagnosis not present

## 2018-11-06 ENCOUNTER — Encounter: Payer: Self-pay | Admitting: Medical

## 2018-11-06 ENCOUNTER — Other Ambulatory Visit: Payer: Self-pay

## 2018-11-06 ENCOUNTER — Ambulatory Visit: Payer: BC Managed Care – PPO | Admitting: Medical

## 2018-11-06 VITALS — BP 120/84 | HR 97 | Temp 98.9°F | Ht 71.0 in | Wt 170.0 lb

## 2018-11-06 DIAGNOSIS — Z2821 Immunization not carried out because of patient refusal: Secondary | ICD-10-CM | POA: Diagnosis not present

## 2018-11-06 DIAGNOSIS — D369 Benign neoplasm, unspecified site: Secondary | ICD-10-CM

## 2018-11-06 DIAGNOSIS — G2581 Restless legs syndrome: Secondary | ICD-10-CM

## 2018-11-06 DIAGNOSIS — J329 Chronic sinusitis, unspecified: Secondary | ICD-10-CM

## 2018-11-06 DIAGNOSIS — Z0000 Encounter for general adult medical examination without abnormal findings: Secondary | ICD-10-CM | POA: Diagnosis not present

## 2018-11-06 DIAGNOSIS — Z720 Tobacco use: Secondary | ICD-10-CM

## 2018-11-06 DIAGNOSIS — Z862 Personal history of diseases of the blood and blood-forming organs and certain disorders involving the immune mechanism: Secondary | ICD-10-CM

## 2018-11-06 DIAGNOSIS — G4700 Insomnia, unspecified: Secondary | ICD-10-CM

## 2018-11-06 DIAGNOSIS — Z7185 Encounter for immunization safety counseling: Secondary | ICD-10-CM

## 2018-11-06 DIAGNOSIS — K635 Polyp of colon: Secondary | ICD-10-CM | POA: Diagnosis not present

## 2018-11-06 DIAGNOSIS — Z7189 Other specified counseling: Secondary | ICD-10-CM

## 2018-11-06 DIAGNOSIS — E785 Hyperlipidemia, unspecified: Secondary | ICD-10-CM

## 2018-11-06 MED ORDER — HYDROCORTISONE 2.5 % EX CREA: TOPICAL_CREAM | Freq: Two times a day (BID) | CUTANEOUS | 2 refills | Status: DC

## 2018-11-06 NOTE — Progress Notes (Signed)
Subjective:   HPI  Jackson Arnold is a 53 y.o. male who presents for Chief Complaint  Patient presents with  . Annual Exam    Medical care team includes: Tysinger, Camelia Eng, PA-C here for primary care Dentist, [facility], [place] Dr. Bellevue Cellar, GI Dr. Benjamine Mola, ENT   Concerns: Had colonoscopy 05/2017 showing tubular adenoma, repeat in 5 years.  Last PSA screen normal 08/2017.  Last tetanus booster within last 10 years.  No prior shingles vaccine.  declines flu shot.  Been doing well with breathing since sinus surgery.  But in last 3 weeks being at home, can't sleep due to legs seeming to be on the move. Works at night.  Legs feel like they are moving all the time.    For past year or more, has restless legs intermittent.  Worse at night, occasionally during day.   Can't get comfortable.  interferes with sleep . Has used ambien in the past to help.  No spasm, no injury or trauma.   Does have some stress.  Trying to sell his house and move to larger house.   No other stressors.  Continues to occasionally have itching at anus. Has used hydrocortisone cream in the past, would like refill on this.     Reviewed their medical, surgical, family, social, medication, and allergy history and updated chart as appropriate.  Past Medical History:  Diagnosis Date  . Bronchitis   . Chest pain 06/2011   ED/observation visit  . H/O CT scan 06/2011   cardiac, normal  . History of EKG 5/13   sinus bradycardia  . Hyperlipidemia    tricor prior  . Nausea and vomiting after administration of anesthetic agent   . 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 Tobacco use     Past Surgical History:  Procedure Laterality Date  . COLONOSCOPY  05/2017   tubular adenoma, polyps, repeat 2024; Dr. Havery Moros  . LUNG BIOPSY  1998  . TONSILLECTOMY      Social History   Socioeconomic History  . Marital status: Divorced    Spouse name: Not on file  . Number of children: Not on file   . Years of education: Not on file  . Highest education level: Not on file  Occupational History  . Not on file  Social Needs  . Financial resource strain: Not on file  . Food insecurity    Worry: Not on file    Inability: Not on file  . Transportation needs    Medical: Not on file    Non-medical: Not on file  Tobacco Use  . Smoking status: Former Smoker    Packs/day: 1.00    Years: 30.00    Pack years: 30.00    Types: Cigarettes    Start date: 08/19/2017  . Smokeless tobacco: Never Used  Substance and Sexual Activity  . Alcohol use: No  . Drug use: No  . Sexual activity: Not on file  Lifestyle  . Physical activity    Days per week: Not on file    Minutes per session: Not on file  . Stress: Not on file  Relationships  . Social Herbalist on phone: Not on file    Gets together: Not on file    Attends religious service: Not on file    Active member of club or organization: Not on file    Attends meetings of clubs or organizations: Not on file    Relationship status: Not  on file  . Intimate partner violence    Fear of current or ex partner: Not on file    Emotionally abused: Not on file    Physically abused: Not on file    Forced sexual activity: Not on file  Other Topics Concern  . 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   Doing some home renovations 10/2018, trying to sell house.    10/2018    Family History  Problem Relation Age of Onset  . Diabetes Mother   . Cancer Mother        skin, lymphoma  . Lung cancer Father   . Cancer Maternal Aunt        colon  . Colon cancer Maternal Aunt   . Stroke Paternal Grandmother   . Hypertension Neg Hx   . Esophageal cancer Neg Hx   . Liver cancer Neg Hx   . Pancreatic cancer Neg Hx   . Prostate cancer Neg Hx   . Rectal cancer Neg Hx   . Stomach cancer Neg Hx      Current Outpatient Medications:  .   atorvastatin (LIPITOR) 20 MG tablet, atorvastatin 20 mg tablet, Disp: , Rfl:  .  fexofenadine (ALLEGRA) 180 MG tablet, Take 180 mg by mouth daily., Disp: , Rfl:  .  hydrocortisone 2.5 % cream, Apply topically 2 (two) times daily., Disp: 30 g, Rfl: 2 .  ibuprofen (ADVIL,MOTRIN) 200 MG tablet, Take 600 mg by mouth every 6 (six) hours as needed. For pain, Disp: , Rfl:  .  PROAIR HFA 108 (90 Base) MCG/ACT inhaler, Inhale 2 puffs into the lungs every 6 (six) hours as needed for wheezing or shortness of breath. (Patient not taking: Reported on 11/06/2018), Disp: 18 g, Rfl: 1  Current Facility-Administered Medications:  .  0.9 %  sodium chloride infusion, 500 mL, Intravenous, Once, Armbruster, Carlota Raspberry, MD  Allergies  Allergen Reactions  . Tramadol Nausea And Vomiting    Review of Systems Constitutional: -fever, -chills, -sweats, -unexpected weight change, -decreased appetite, -fatigue Allergy: -sneezing, -itching, -congestion Dermatology: -changing moles, --rash, -lumps ENT: -runny nose, -ear pain, -sore throat, -hoarseness, -sinus pain, -teeth pain, - ringing in ears, -hearing loss, -nosebleeds Cardiology: -chest pain, -palpitations, -swelling, -difficulty breathing when lying flat, -waking up short of breath Respiratory: +cough, -shortness of breath, -difficulty breathing with exercise or exertion, -wheezing, -coughing up blood Gastroenterology: -abdominal pain, -nausea, -vomiting, -diarrhea, -constipation, -blood in stool, -changes in bowel movement, -difficulty swallowing or eating Hematology: -bleeding, -bruising  Musculoskeletal: +joint aches, +muscle aches, -joint swelling, -back pain, -neck pain, -cramping, -changes in gait Ophthalmology: denies vision changes, eye redness, itching, discharge Urology: -burning with urination, -difficulty urinating, -blood in urine, -urinary frequency, -urgency, -incontinence Neurology: -headache, -weakness, -tingling, -numbness, -memory loss, -falls,  -dizziness Psychology: -depressed mood, -agitation, -sleep problems Male GU: no testicular mass, pain, no lymph nodes swollen, no swelling, no rash.     Objective:  BP 120/84   Pulse 97   Temp 98.9 F (37.2 C)   Ht 5\' 11"  (1.803 m)   Wt 170 lb (77.1 kg)   SpO2 97%   BMI 23.71 kg/m   General appearance: alert, no distress, WD/WN, African American male Skin: unremarkable HEENT: normocephalic, conjunctiva/corneas normal, sclerae anicteric, PERRLA, EOMi, nares patent, no discharge or erythema, pharynx normal Oral cavity: MMM, tongue normal, teeth normal Neck: supple, no lymphadenopathy, no thyromegaly, no masses, normal ROM,  no bruits Chest: non tender, normal shape and expansion Heart: RRR, normal S1, S2, no murmurs Lungs: somewhat decreased breath sounds, no wheezes, rhonchi, or rales Abdomen: +bs, soft, non tender, non distended, no masses, no hepatomegaly, no splenomegaly, no bruits Back: non tender, normal ROM, no scoliosis Musculoskeletal: left forearm without deformity or tenderness, upper extremities non tender, no obvious deformity, normal ROM throughout, lower extremities non tender, no obvious deformity, normal ROM throughout Extremities: no edema, no cyanosis, no clubbing Pulses: 2+ symmetric, upper and lower extremities, normal cap refill Neurological: alert, oriented x 3, CN2-12 intact, strength normal upper extremities and lower extremities, sensation normal throughout, DTRs 2+ throughout, no cerebellar signs, gait normal Psychiatric: normal affect, behavior normal, pleasant  GU: normal male external genitalia,circumcised, nontender, no masses, no hernia, no lymphadenopathy Rectal:  anus normal appearing   Assessment and Plan :   Encounter Diagnoses  Name Primary?  . Encounter for health maintenance examination in adult Yes  . Influenza vaccination declined   . Tubular adenoma   . Polyp of colon, unspecified part of colon, unspecified type   . Chronic sinusitis,  unspecified location   . Tobacco use   . History of sarcoidosis   . Vaccine counseling   . Hyperlipidemia, unspecified hyperlipidemia type   . Insomnia, unspecified type   . Restless leg syndrome     Physical exam - discussed and counseled on healthy lifestyle, diet, exercise, preventative care, vaccinations, sick and well care, proper use of emergency dept and after hours care, and addressed their concerns.    Health screening: See your eye doctor yearly for routine vision care. See your dentist yearly for routine dental care including hygiene visits twice yearly.  Cancer screening Reviewed colonoscopy on file that is up to date  Discussed PSA, prostate exam, and prostate cancer screening risks/benefits.   reviewed 2019 PSA that was normal.  Vaccinations: Advised yearly influenza vaccine.   He declines  He reports being up to date on Td vaccine.  Shingles vaccine:  I recommend you have a shingles vaccine to help prevent shingles or herpes zoster outbreak.   Please call your insurer to inquire about coverage for the Shingrix vaccine given in 2 doses.   Some insurers cover this vaccine after age 83, some cover this after age 54.  If your insurer covers this, then call to schedule appointment to have this vaccine here.    Other issues discussed: RLS - labs today , consider trial of Requip  counseled on smoking cessation.  Expressed sympathy for loss of his mom this past year.   hyperlipidemia - labs today, c/t statin  Jecory was seen today for annual exam.  Diagnoses and all orders for this visit:  Encounter for health maintenance examination in adult -     Comprehensive metabolic panel -     CBC -     Lipid panel -     VITAMIN D 25 Hydroxy (Vit-D Deficiency, Fractures) -     Iron -     Vitamin B12  Influenza vaccination declined  Tubular adenoma  Polyp of colon, unspecified part of colon, unspecified type  Chronic sinusitis, unspecified location  Tobacco  use  History of sarcoidosis  Vaccine counseling  Hyperlipidemia, unspecified hyperlipidemia type  Insomnia, unspecified type  Restless leg syndrome -     VITAMIN D 25 Hydroxy (Vit-D Deficiency, Fractures) -     Iron -     Vitamin B12  Other orders -     hydrocortisone 2.5 %  cream; Apply topically 2 (two) times daily.   Follow-up pending labs, yearly for physical

## 2018-11-07 ENCOUNTER — Other Ambulatory Visit: Payer: Self-pay | Admitting: Medical

## 2018-11-07 LAB — CBC
Hematocrit: 44.4 % (ref 37.5–51.0)
Hemoglobin: 15.2 g/dL (ref 13.0–17.7)
MCH: 30.1 pg (ref 26.6–33.0)
MCHC: 34.2 g/dL (ref 31.5–35.7)
MCV: 88 fL (ref 79–97)
Platelets: 346 10*3/uL (ref 150–450)
RBC: 5.05 x10E6/uL (ref 4.14–5.80)
RDW: 12.9 % (ref 11.6–15.4)
WBC: 10.9 10*3/uL — ABNORMAL HIGH (ref 3.4–10.8)

## 2018-11-07 LAB — LIPID PANEL
Chol/HDL Ratio: 4.4 ratio (ref 0.0–5.0)
Cholesterol, Total: 162 mg/dL (ref 100–199)
HDL: 37 mg/dL — ABNORMAL LOW (ref >39)
LDL Chol Calc (NIH): 109 mg/dL — ABNORMAL HIGH (ref 0–99)
Triglycerides: 87 mg/dL (ref 0–149)
VLDL Cholesterol Cal: 16 mg/dL (ref 5–40)

## 2018-11-07 LAB — VITAMIN D 25 HYDROXY (VIT D DEFICIENCY, FRACTURES): Vit D, 25-Hydroxy: 15.6 ng/mL — ABNORMAL LOW (ref 30.0–100.0)

## 2018-11-07 LAB — COMPREHENSIVE METABOLIC PANEL
ALT: 33 IU/L (ref 0–44)
AST: 26 IU/L (ref 0–40)
Albumin/Globulin Ratio: 2 (ref 1.2–2.2)
Albumin: 4.6 g/dL (ref 3.8–4.9)
Alkaline Phosphatase: 108 IU/L (ref 39–117)
BUN/Creatinine Ratio: 9 (ref 9–20)
BUN: 8 mg/dL (ref 6–24)
Bilirubin Total: 0.6 mg/dL (ref 0.0–1.2)
CO2: 24 mmol/L (ref 20–29)
Calcium: 10.1 mg/dL (ref 8.7–10.2)
Chloride: 106 mmol/L (ref 96–106)
Creatinine, Ser: 0.94 mg/dL (ref 0.76–1.27)
GFR calc Af Amer: 108 mL/min/{1.73_m2} (ref >59)
GFR calc non Af Amer: 93 mL/min/{1.73_m2} (ref >59)
Globulin, Total: 2.3 g/dL (ref 1.5–4.5)
Glucose: 79 mg/dL (ref 65–99)
Potassium: 5.3 mmol/L — ABNORMAL HIGH (ref 3.5–5.2)
Sodium: 144 mmol/L (ref 134–144)
Total Protein: 6.9 g/dL (ref 6.0–8.5)

## 2018-11-07 LAB — IRON: Iron: 121 ug/dL (ref 38–169)

## 2018-11-07 LAB — VITAMIN B12: Vitamin B-12: 481 pg/mL (ref 232–1245)

## 2018-11-07 MED ORDER — VITAMIN D 25 MCG (1000 UNIT) PO TABS: 1000.0000 [IU] | ORAL_TABLET | Freq: Every day | ORAL | 3 refills | Status: DC

## 2018-11-07 MED ORDER — ROPINIROLE HCL 0.5 MG PO TABS: 0.5000 mg | ORAL_TABLET | Freq: Three times a day (TID) | ORAL | 2 refills | Status: DC

## 2018-12-10 ENCOUNTER — Other Ambulatory Visit: Payer: Self-pay | Admitting: Medical

## 2018-12-10 ENCOUNTER — Telehealth: Payer: Self-pay | Admitting: Medical

## 2018-12-10 MED ORDER — ATORVASTATIN CALCIUM 20 MG PO TABS: ORAL_TABLET | ORAL | 3 refills | Status: DC

## 2018-12-10 NOTE — Telephone Encounter (Signed)
Pt needs refill on Atorvastatin sent to the [facility] on [address].

## 2019-05-01 DIAGNOSIS — J322 Chronic ethmoidal sinusitis: Secondary | ICD-10-CM | POA: Diagnosis not present

## 2019-05-01 DIAGNOSIS — J310 Chronic rhinitis: Secondary | ICD-10-CM | POA: Diagnosis not present

## 2019-05-01 DIAGNOSIS — J320 Chronic maxillary sinusitis: Secondary | ICD-10-CM | POA: Diagnosis not present

## 2019-10-16 ENCOUNTER — Other Ambulatory Visit: Payer: Self-pay | Admitting: Medical

## 2019-11-07 ENCOUNTER — Encounter: Payer: Self-pay | Admitting: Medical

## 2019-11-07 ENCOUNTER — Other Ambulatory Visit: Payer: Self-pay

## 2019-11-07 ENCOUNTER — Ambulatory Visit (INDEPENDENT_AMBULATORY_CARE_PROVIDER_SITE_OTHER): Payer: BC Managed Care – PPO | Admitting: Medical

## 2019-11-07 VITALS — BP 138/88 | HR 70 | Ht 71.0 in | Wt 176.4 lb

## 2019-11-07 DIAGNOSIS — R748 Abnormal levels of other serum enzymes: Secondary | ICD-10-CM

## 2019-11-07 DIAGNOSIS — J329 Chronic sinusitis, unspecified: Secondary | ICD-10-CM

## 2019-11-07 DIAGNOSIS — E785 Hyperlipidemia, unspecified: Secondary | ICD-10-CM

## 2019-11-07 DIAGNOSIS — Z125 Encounter for screening for malignant neoplasm of prostate: Secondary | ICD-10-CM | POA: Diagnosis not present

## 2019-11-07 DIAGNOSIS — Z0000 Encounter for general adult medical examination without abnormal findings: Secondary | ICD-10-CM | POA: Diagnosis not present

## 2019-11-07 DIAGNOSIS — L989 Disorder of the skin and subcutaneous tissue, unspecified: Secondary | ICD-10-CM

## 2019-11-07 DIAGNOSIS — D72829 Elevated white blood cell count, unspecified: Secondary | ICD-10-CM

## 2019-11-07 DIAGNOSIS — Z7185 Encounter for immunization safety counseling: Secondary | ICD-10-CM

## 2019-11-07 DIAGNOSIS — Z862 Personal history of diseases of the blood and blood-forming organs and certain disorders involving the immune mechanism: Secondary | ICD-10-CM | POA: Diagnosis not present

## 2019-11-07 DIAGNOSIS — E559 Vitamin D deficiency, unspecified: Secondary | ICD-10-CM | POA: Diagnosis not present

## 2019-11-07 DIAGNOSIS — Z720 Tobacco use: Secondary | ICD-10-CM | POA: Diagnosis not present

## 2019-11-07 DIAGNOSIS — Z23 Encounter for immunization: Secondary | ICD-10-CM

## 2019-11-07 DIAGNOSIS — Z7189 Other specified counseling: Secondary | ICD-10-CM

## 2019-11-07 MED ORDER — ZOLPIDEM TARTRATE 10 MG PO TABS: 5.0000 mg | ORAL_TABLET | Freq: Every evening | ORAL | 1 refills | Status: DC | PRN

## 2019-11-07 MED ORDER — CHANTIX STARTING MONTH PAK 0.5 MG X 11 & 1 MG X 42 PO TABS: ORAL_TABLET | ORAL | 0 refills | Status: DC

## 2019-11-07 NOTE — Patient Instructions (Addendum)
Specific issues today  We updated your tetanus and diphtheria and pertussis vaccine today.  This is good for 10 years  Shingles vaccine:  I recommend you have a shingles vaccine to help prevent shingles or herpes zoster outbreak.   Please call your insurer to inquire about coverage for the Shingrix vaccine given in 2 doses.   Some insurers cover this vaccine after age 52, some cover this after age 69.  If your insurer covers this, then call to schedule appointment to have this vaccine here.  We will refer you to dermatology for skin lesion of low back  Begin Chantix.  Stop smoking.  Lets recheck in 1 month  .  Health Maintenance, Male Adopting a healthy lifestyle and getting preventive care are important in promoting health and wellness. Ask your health care provider about:  The right schedule for you to have regular tests and exams.  Things you can do on your own to prevent diseases and keep yourself healthy. What should I know about diet, weight, and exercise? Eat a healthy diet   Eat a diet that includes plenty of vegetables, fruits, low-fat dairy products, and lean protein.  Do not eat a lot of foods that are high in solid fats, added sugars, or sodium. Maintain a healthy weight Body mass index (BMI) is a measurement that can be used to identify possible weight problems. It estimates body fat based on height and weight. Your health care provider can help determine your BMI and help you achieve or maintain a healthy weight. Get regular exercise Get regular exercise. This is one of the most important things you can do for your health. Most adults should:  Exercise for at least 150 minutes each week. The exercise should increase your heart rate and make you sweat (moderate-intensity exercise).  Do strengthening exercises at least twice a week. This is in addition to the moderate-intensity exercise.  Spend less time sitting. Even light physical activity can be beneficial. Watch  cholesterol and blood lipids Have your blood tested for lipids and cholesterol at 52 years of age, then have this test every 5 years. You may need to have your cholesterol levels checked more often if:  Your lipid or cholesterol levels are high.  You are older than 52 years of age.  You are at high risk for heart disease. What should I know about cancer screening? Many types of cancers can be detected early and may often be prevented. Depending on your health history and family history, you may need to have cancer screening at various ages. This may include screening for:  Colorectal cancer.  Prostate cancer.  Skin cancer.  Lung cancer. What should I know about heart disease, diabetes, and high blood pressure? Blood pressure and heart disease  High blood pressure causes heart disease and increases the risk of stroke. This is more likely to develop in people who have high blood pressure readings, are of African descent, or are overweight.  Talk with your health care provider about your target blood pressure readings.  Have your blood pressure checked: ? Every 3-5 years if you are 24-67 years of age. ? Every year if you are 63 years old or older.  If you are between the ages of 63 and 23 and are a current or former smoker, ask your health care provider if you should have a one-time screening for abdominal aortic aneurysm (AAA). Diabetes Have regular diabetes screenings. This checks your fasting blood sugar level. Have the screening done:  Once every three years after age 90 if you are at a normal weight and have a low risk for diabetes.  More often and at a younger age if you are overweight or have a high risk for diabetes. What should I know about preventing infection? Hepatitis B If you have a higher risk for hepatitis B, you should be screened for this virus. Talk with your health care provider to find out if you are at risk for hepatitis B infection. Hepatitis C Blood  testing is recommended for:  Everyone born from [birth year] through 1963.  Anyone with known risk factors for hepatitis C. Sexually transmitted infections (STIs)  You should be screened each year for STIs, including gonorrhea and chlamydia, if: ? You are sexually active and are younger than 52 years of age. ? You are older than 52 years of age and your health care provider tells you that you are at risk for this type of infection. ? Your sexual activity has changed since you were last screened, and you are at increased risk for chlamydia or gonorrhea. Ask your health care provider if you are at risk.  Ask your health care provider about whether you are at high risk for HIV. Your health care provider may recommend a prescription medicine to help prevent HIV infection. If you choose to take medicine to prevent HIV, you should first get tested for HIV. You should then be tested every 3 months for as long as you are taking the medicine. Follow these instructions at home: Lifestyle  Do not use any products that contain nicotine or tobacco, such as cigarettes, e-cigarettes, and chewing tobacco. If you need help quitting, ask your health care provider.  Do not use street drugs.  Do not share needles.  Ask your health care provider for help if you need support or information about quitting drugs. Alcohol use  Do not drink alcohol if your health care provider tells you not to drink.  If you drink alcohol: ? Limit how much you have to 0-2 drinks a day. ? Be aware of how much alcohol is in your drink. In the [place] one drink equals one 12 oz bottle of beer (355 mL), one 5 oz glass of wine (148 mL), or one 1 oz glass of hard liquor (44 mL). General instructions  Schedule regular health, dental, and eye exams.  Stay current with your vaccines.  Tell your health care provider if: ? You often feel depressed. ? You have ever been abused or do not feel safe at home. Summary  Adopting a healthy  lifestyle and getting preventive care are important in promoting health and wellness.  Follow your health care provider's instructions about healthy diet, exercising, and getting tested or screened for diseases.  Follow your health care provider's instructions on monitoring your cholesterol and blood pressure. This information is not intended to replace advice given to you by your health care provider. Make sure you discuss any questions you have with your health care provider. Document Revised: 01/24/2018 Document Reviewed: 01/24/2018 Elsevier Patient Education  2020 [employer].

## 2019-11-07 NOTE — Progress Notes (Signed)
Complete physical exam   Patient: Jackson Arnold   DOB: 29-Nov-1965   54 y.o. Male  MRN: [id number] Visit Date: 11/07/2019  Today's healthcare provider: Dorothea Ogle, PA-C   Chief Complaint  Patient presents with   Annual Exam    with fasting labs     Subjective    Jackson Arnold is a 54 y.o. male who presents today for a complete physical exam.  He reports consuming a general low sodium diet. The patient does not participate in regular exercise at present. He generally feels well. He reports sleeping well but sometimes fatigue from working a lot.  Still smokes.  Tried and failed Wellbutrin in past . Never tried chantix.  Thinking about quitting tobacco.   Smokes about 1ppd.  Needs refill on Ambien.  Last used this 2 years ago. But working swing shifts where he may work 3 days on , 2 days off, and alternates every week.  This causes him to wake up several times per night.  No recent problems with restless legs.   Tends to not have this problem if working regularly    HPI    Annual Exam     Additional comments: with fasting labs        Last edited by Edgar Frisk, [place] on 11/07/2019  8:10 AM. 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 Havery Moros   LUNG BIOPSY  1998   TONSILLECTOMY      Family Status  Relation Name Status   Mother  Alive   Father  Deceased   Sister  Alive   Brother  Alive   Mat Aunt  Deceased   PGM  (Not Specified)   Neg Hx  (Not Specified)   Family History  Problem Relation Age of Onset   Diabetes Mother    Cancer Mother         skin, lymphoma   Lung cancer Father    Cancer Maternal Aunt        colon   Colon cancer Maternal Aunt    Stroke Paternal Grandmother    Hypertension Neg Hx    Esophageal cancer Neg Hx    Liver cancer Neg Hx    Pancreatic cancer Neg Hx    Prostate cancer Neg Hx    Rectal cancer Neg Hx    Stomach cancer Neg Hx    Allergies  Allergen Reactions   Tramadol Nausea And Vomiting    Patient Care Team: Saphia Vanderford, Camelia Eng, PA-C as PCP - General (Family Medicine)   Medications: Outpatient Medications Prior to Visit  Medication Sig   atorvastatin (LIPITOR) 20 MG tablet atorvastatin 20 mg tablet   cholecalciferol (VITAMIN D3) 25 MCG (1000 UT) tablet Take 1 tablet (1,000 Units total) by mouth daily.   [DISCONTINUED] CVS D3 25 MCG (1000 UT) capsule TAKE 1 CAPSULE BY MOUTH EVERY DAY (Patient not taking: Reported on 11/07/2019)   [DISCONTINUED] fexofenadine (ALLEGRA) 180 MG tablet Take 180 mg by mouth daily. (Patient not  taking: Reported on 11/07/2019)   [DISCONTINUED] hydrocortisone 2.5 % cream Apply topically 2 (two) times daily. (Patient not taking: Reported on 11/07/2019)   [DISCONTINUED] ibuprofen (ADVIL,MOTRIN) 200 MG tablet Take 600 mg by mouth every 6 (six) hours as needed. For pain (Patient not taking: Reported on 11/07/2019)   [DISCONTINUED] PROAIR HFA 108 (90 Base) MCG/ACT inhaler Inhale 2 puffs into the lungs every 6 (six) hours as needed for wheezing or shortness of breath. (Patient not taking: Reported on 11/06/2018)   [DISCONTINUED] rOPINIRole (REQUIP) 0.5 MG tablet Take 1 tablet (0.5 mg total) by mouth 3 (three) times daily. (Patient not taking: Reported on 11/07/2019)   Facility-Administered Medications Prior to Visit  Medication Dose Route Frequency Provider   0.9 %  sodium chloride infusion  500 mL Intravenous Once Armbruster, Carlota Raspberry, MD    Review of Systems  Constitutional: Negative.   HENT: Negative.   Eyes: Negative.   Respiratory: Negative.     Cardiovascular: Negative.   Gastrointestinal: Negative.   Endocrine: Negative.   Genitourinary: Negative.   Musculoskeletal: Negative.   Skin: Negative.   Allergic/Immunologic: Negative.   Neurological: Negative.   Hematological: Negative.   Psychiatric/Behavioral: Positive for sleep disturbance.      Objective    BP 138/88    Pulse 70    Ht 5\' 11"  (1.803 m)    Wt 176 lb 6.4 oz (80 kg)    SpO2 98%    BMI 24.60 kg/m  BP Readings from Last 3 Encounters:  11/07/19 138/88  11/06/18 120/84  12/27/17 128/80   Wt Readings from Last 3 Encounters:  11/07/19 176 lb 6.4 oz (80 kg)  11/06/18 170 lb (77.1 kg)  12/27/17 177 lb 12.8 oz (80.6 kg)     Physical Exam Constitutional:      Appearance: Normal appearance.  HENT:     Head: Normocephalic and atraumatic.     Right Ear: Tympanic membrane, ear canal and external ear normal.     Left Ear: Tympanic membrane, ear canal and external ear normal.     Nose: Nose normal.     Mouth/Throat:     Mouth: Mucous membranes are dry.     Pharynx: Oropharynx is clear.  Eyes:     Extraocular Movements: Extraocular movements intact.     Conjunctiva/sclera: Conjunctivae normal.     Pupils: Pupils are equal, round, and reactive to light.  Cardiovascular:     Rate and Rhythm: Normal rate and regular rhythm.     Pulses: Normal pulses.     Heart sounds: Normal heart sounds.  Pulmonary:     Effort: Pulmonary effort is normal.     Breath sounds: Normal breath sounds.  Abdominal:     General: Abdomen is flat. Bowel sounds are normal.     Palpations: Abdomen is soft.  Genitourinary:    Penis: Normal.      Testes: Normal.     Rectum: Normal.     Comments: Prostate moderate enlargment without nodules Musculoskeletal:        General: Normal range of motion.     Cervical back: Normal range of motion.  Skin:    General: Skin is warm and dry.     Capillary Refill: Capillary refill takes less than 2 seconds.     Findings: Lesion present.      Comments: Scattered macules.  Lower back with 2 specific lesions.  1 is somewhat reddish asymmetric approximately 3 mm x 3 mm, there is another lesion just inferior to  this that is purplish black, 3 mm diameter with fuzzy border, both somewhat abnormal looking.  No other worrisome lesions of the skin  Neurological:     General: No focal deficit present.     Mental Status: He is alert. Mental status is at baseline. He is disoriented.  Psychiatric:        Mood and Affect: Mood normal.        Behavior: Behavior normal.        Thought Content: Thought content normal.        Judgment: Judgment normal.       Last depression screening scores PHQ 2/9 Scores 11/07/2019 11/06/2018 08/14/2017  PHQ - 2 Score 0 0 0      Assessment & Plan   Encounter Diagnoses  Name Primary?   Encounter for health maintenance examination in adult Yes   Hyperlipidemia, unspecified hyperlipidemia type    Tobacco use    History of sarcoidosis    Chronic sinusitis, unspecified location    Need for Tdap vaccination    Leukocytosis, unspecified type    Low serum HDL    Vitamin D deficiency    Vaccine counseling    Screening for prostate cancer    Routine general medical examination at a health care facility    Vaccine for diphtheria-tetanus-pertussis, combined    Skin lesion       Routine Health Maintenance and Physical Exam  Exercise Activities and Dietary recommendations Goals   None     Immunization History  Administered Date(s) Administered   Moderna SARS-COVID-2 Vaccination 09/06/2019, 10/04/2019   Pneumococcal Polysaccharide-23 10/10/2012   Tdap 11/07/2019    Health Maintenance  Topic Date Due   HIV Screening  Never done   INFLUENZA VACCINE  Never done   COLONOSCOPY  05/16/2022   TETANUS/TDAP  11/06/2029   COVID-19 Vaccine  Completed   Hepatitis C Screening  Completed    Discussed health benefits of physical activity, and encouraged him to engage in regular  exercise appropriate for his age and condition.   Physical exam - discussed and counseled on healthy lifestyle, diet, exercise, preventative care, vaccinations, sick and well care, proper use of emergency dept and after hours care, and addressed their concerns.    Health screening: See your eye doctor yearly for routine vision care. See your dentist yearly for routine dental care including hygiene visits twice yearly.  Cancer screening Reviewed colonoscopy on file that is up to date  Discussed PSA, prostate exam, and prostate cancer screening risks/benefits.       Vaccinations: Advised yearly influenza vaccine Patient declines influenza vaccine Declines pneumococcal vaccine  Counseled on the Tdap (tetanus, diptheria, and acellular pertussis) vaccine.  Vaccine information sheet given. Tdap vaccine given after consent obtained.  Shingles vaccine:  I recommend you have a shingles vaccine to help prevent shingles or herpes zoster outbreak.   Please call your insurer to inquire about coverage for the Shingrix vaccine given in 2 doses.   Some insurers cover this vaccine after age 11, some cover this after age 51.  If your insurer covers this, then call to schedule appointment to have this vaccine here.   Acute issues discussed: Insomnia -begin back on Ambien as needed.  Discussed risk and benefits of medication.  Discussed sleep hygiene.  Discussed not relying on this medicine every single day.   Separate significant chronic issues discussed: Tobacco use-agreeable to trial of Chantix.  Discussed risk and benefits of medication.  Encouraged him to call 1 800  quit now.  Follow-up in 1 month  Skin lesion of low back, family history of skin cancer -referral to dermatology  Vitamin D deficiency-continue supplement, labs today  HDL low on last year's labs-counseled on healthy  diet  Leukocytosis last year labs-repeat labs today  Zarian was seen today for annual exam.  Diagnoses and  all orders for this visit:  Encounter for health maintenance examination in adult  Hyperlipidemia, unspecified hyperlipidemia type  Tobacco use  History of sarcoidosis  Chronic sinusitis, unspecified location  Need for Tdap vaccination  Leukocytosis, unspecified type  Low serum HDL  Vitamin D deficiency -     VITAMIN D 25 Hydroxy (Vit-D Deficiency, Fractures)  Vaccine counseling  Screening for prostate cancer -     PSA(Must document that pt has been informed of limitations of PSA testing.)  Routine general medical examination at a health care facility -     CBC with Differential -     Comprehensive metabolic panel -     Lipid panel -     PSA(Must document that pt has been informed of limitations of PSA testing.) -     VITAMIN D 25 Hydroxy (Vit-D Deficiency, Fractures)  Vaccine for diphtheria-tetanus-pertussis, combined -     Tdap vaccine greater than or equal to 7yo IM  Skin lesion -     Ambulatory referral to Dermatology  Other orders -     varenicline (CHANTIX STARTING MONTH PAK) 0.5 MG X 11 & 1 MG X 42 tablet; Take one 0.5 mg tablet by mouth once daily for 3 days, then increase to one 0.5 mg tablet twice daily for 4 days, then increase to one 1 mg tablet twice daily. -     zolpidem (AMBIEN) 10 MG tablet; Take 0.5-1 tablets (5-10 mg total) by mouth at bedtime as needed for sleep.    Follow-up pending labs, yearly for physical     Return in 1 year (on 11/06/2020), or 1 month on smoking cessation.      Dorothea Ogle, PA-C  [place] [phone] (phone) [phone] (fax)  [place]

## 2019-11-08 ENCOUNTER — Other Ambulatory Visit: Payer: Self-pay | Admitting: Medical

## 2019-11-08 LAB — COMPREHENSIVE METABOLIC PANEL
ALT: 23 IU/L (ref 0–44)
AST: 20 IU/L (ref 0–40)
Albumin/Globulin Ratio: 1.8 (ref 1.2–2.2)
Albumin: 4.5 g/dL (ref 3.8–4.9)
Alkaline Phosphatase: 102 IU/L (ref 44–121)
BUN/Creatinine Ratio: 12 (ref 9–20)
BUN: 11 mg/dL (ref 6–24)
Bilirubin Total: 0.3 mg/dL (ref 0.0–1.2)
CO2: 23 mmol/L (ref 20–29)
Calcium: 9.9 mg/dL (ref 8.7–10.2)
Chloride: 103 mmol/L (ref 96–106)
Creatinine, Ser: 0.95 mg/dL (ref 0.76–1.27)
GFR calc Af Amer: 106 mL/min/{1.73_m2} (ref >59)
GFR calc non Af Amer: 92 mL/min/{1.73_m2} (ref >59)
Globulin, Total: 2.5 g/dL (ref 1.5–4.5)
Glucose: 88 mg/dL (ref 65–99)
Potassium: 4.5 mmol/L (ref 3.5–5.2)
Sodium: 141 mmol/L (ref 134–144)
Total Protein: 7 g/dL (ref 6.0–8.5)

## 2019-11-08 LAB — LIPID PANEL
Chol/HDL Ratio: 5 ratio (ref 0.0–5.0)
Cholesterol, Total: 164 mg/dL (ref 100–199)
HDL: 33 mg/dL — ABNORMAL LOW (ref >39)
LDL Chol Calc (NIH): 86 mg/dL (ref 0–99)
Triglycerides: 268 mg/dL — ABNORMAL HIGH (ref 0–149)
VLDL Cholesterol Cal: 45 mg/dL — ABNORMAL HIGH (ref 5–40)

## 2019-11-08 LAB — CBC WITH DIFFERENTIAL/PLATELET
Basophils Absolute: 0.1 10*3/uL (ref 0.0–0.2)
Basos: 1 %
EOS (ABSOLUTE): 0.3 10*3/uL (ref 0.0–0.4)
Eos: 2 %
Hematocrit: 47.6 % (ref 37.5–51.0)
Hemoglobin: 16.2 g/dL (ref 13.0–17.7)
Immature Grans (Abs): 0 10*3/uL (ref 0.0–0.1)
Immature Granulocytes: 0 %
Lymphocytes Absolute: 4.5 10*3/uL — ABNORMAL HIGH (ref 0.7–3.1)
Lymphs: 36 %
MCH: 30.5 pg (ref 26.6–33.0)
MCHC: 34 g/dL (ref 31.5–35.7)
MCV: 90 fL (ref 79–97)
Monocytes Absolute: 1.2 10*3/uL — ABNORMAL HIGH (ref 0.1–0.9)
Monocytes: 9 %
Neutrophils Absolute: 6.5 10*3/uL (ref 1.4–7.0)
Neutrophils: 52 %
Platelets: 241 10*3/uL (ref 150–450)
RBC: 5.32 x10E6/uL (ref 4.14–5.80)
RDW: 12.9 % (ref 11.6–15.4)
WBC: 12.5 10*3/uL — ABNORMAL HIGH (ref 3.4–10.8)

## 2019-11-08 LAB — VITAMIN D 25 HYDROXY (VIT D DEFICIENCY, FRACTURES): Vit D, 25-Hydroxy: 29.1 ng/mL — ABNORMAL LOW (ref 30.0–100.0)

## 2019-11-08 LAB — PSA: Prostate Specific Ag, Serum: 0.6 ng/mL (ref 0.0–4.0)

## 2019-11-08 MED ORDER — VITAMIN D 25 MCG (1000 UNIT) PO TABS: 1000.0000 [IU] | ORAL_TABLET | Freq: Every day | ORAL | 3 refills | Status: DC

## 2019-11-08 MED ORDER — ATORVASTATIN CALCIUM 20 MG PO TABS: ORAL_TABLET | ORAL | 3 refills | Status: DC

## 2019-11-15 ENCOUNTER — Telehealth: Payer: Self-pay | Admitting: Medical

## 2019-11-15 ENCOUNTER — Other Ambulatory Visit: Payer: Self-pay | Admitting: Medical

## 2019-11-15 NOTE — Telephone Encounter (Signed)
Left message for pt

## 2019-11-15 NOTE — Telephone Encounter (Signed)
See if agreeable to try Wellbutrin which is the other typical option.  I recommend doing this along with calling 1 800 quit now hotline

## 2019-11-15 NOTE — Telephone Encounter (Signed)
Pt called back & states Wellbutrin is just fine & I gave him the 1 800 quit Now #.  Please send into pharmacy

## 2019-11-15 NOTE — Telephone Encounter (Signed)
Pt called in about problems getting his Chantix.  Called pharmacy & was told Chantix on national back order and manufacturer no longer making this at the present time and no idea when it will be available again. Do you want to switch to something else?

## 2019-11-18 ENCOUNTER — Other Ambulatory Visit: Payer: Self-pay | Admitting: Medical

## 2019-11-18 MED ORDER — BUPROPION HCL ER (XL) 150 MG PO TB24: 150.0000 mg | ORAL_TABLET | ORAL | 2 refills | Status: DC

## 2019-12-02 ENCOUNTER — Ambulatory Visit (INDEPENDENT_AMBULATORY_CARE_PROVIDER_SITE_OTHER): Payer: BC Managed Care – PPO | Admitting: Medical

## 2019-12-02 ENCOUNTER — Other Ambulatory Visit: Payer: Self-pay

## 2019-12-02 ENCOUNTER — Encounter: Payer: Self-pay | Admitting: Medical

## 2019-12-02 VITALS — BP 128/88 | HR 70 | Ht 71.0 in | Wt 174.0 lb

## 2019-12-02 DIAGNOSIS — E559 Vitamin D deficiency, unspecified: Secondary | ICD-10-CM

## 2019-12-02 DIAGNOSIS — D72829 Elevated white blood cell count, unspecified: Secondary | ICD-10-CM | POA: Diagnosis not present

## 2019-12-02 DIAGNOSIS — R748 Abnormal levels of other serum enzymes: Secondary | ICD-10-CM

## 2019-12-02 DIAGNOSIS — Z7185 Encounter for immunization safety counseling: Secondary | ICD-10-CM

## 2019-12-02 DIAGNOSIS — Z720 Tobacco use: Secondary | ICD-10-CM

## 2019-12-02 NOTE — Patient Instructions (Signed)
Recommendations  Tobacco cessation  Continue efforts to quit tobacco  Continue Wellbutrin 150 mg XL daily  After being on this a full month if you feel like we need to increase the dose please let me know  I would like you to call back in a month to give me an update on your success with quitting tobacco  Please try counseling to help quit as well such as 1 800 quit NOW hotline   Low HDL, elevated triglycerides  Try to get vigorous exercise regimen such as running or biking or cycling or other moderate intense exercise  Avoid fatty foods, fried foods, fast food  Do eat healthy oils such as nuts, fish, avocado, olive oil for example  Get plenty of fiber in the diet   Low vitamin D  Get out in the sun regularly  Continue to eat fish to try different fish varieties  I recommend you continue to supplement prescription daily  We will plan to recheck this in a year   Shingles vaccine:  I recommend you have a shingles vaccine to help prevent shingles or herpes zoster outbreak.   Please call your insurer to inquire about coverage for the Shingrix vaccine given in 2 doses.   Some insurers cover this vaccine after age 18, some cover this after age 28.  If your insurer covers this, then call to schedule appointment to have this vaccine here.

## 2019-12-02 NOTE — Progress Notes (Signed)
Subjective:  Jackson Arnold is a 54 y.o. male who presents for Chief Complaint  Patient presents with  . Follow-up    labs and wellbutrin      Follow up for tobacco cessation  The patient was last seen for this 26 days ago. Changes made at last visit include patient was going started on Chantix but was recalled. Patient was switched to Wellbutrin 150 MG for about 1- 1.5 weeks now. He reports that he can see a change with the Wellbutrin. He states that he not going outside smoking every 5 minutes. When working he does not smoke cigarettes.  He mainly smokes when he is sitting around not busy  Follow up for cholesterol  The patient was last seen for this 26 days ago. Changes made at last visit include continue with Lipitor daily.  Lab Results  Component Value Date   CHOL 164 11/07/2019   CHOL 162 11/06/2018   CHOL 153 09/26/2017   Lab Results  Component Value Date   HDL 33 (L) 11/07/2019   HDL 37 (L) 11/06/2018   HDL 37 (L) 09/26/2017   Lab Results  Component Value Date   LDLCALC 86 11/07/2019   LDLCALC 109 (H) 11/06/2018   LDLCALC 98 09/26/2017   Lab Results  Component Value Date   TRIG 268 (H) 11/07/2019   TRIG 87 11/06/2018   TRIG 90 09/26/2017   Lab Results  Component Value Date   CHOLHDL 5.0 11/07/2019   CHOLHDL 4.4 11/06/2018   CHOLHDL 4.1 09/26/2017   No results found for: LDLDIRECT   Vitamin D Deficiency Patient was started on Vitamin D3 1,000 units daily. Patient reports that he does eat a lot of Salmon and tuna.   Last vitamin D Lab Results  Component Value Date   VD25OH 29.1 (L) 11/07/2019    Here to discuss recent elevation of white blood cells and lymphocytes.  He denies fever, weight loss, no lymph node enlargement, no change in appetite or weight. 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  He has a history of sarcoidosis.   No other aggravating or relieving factors.    No other c/o.  The following portions  of the patient's history were reviewed and updated as appropriate: allergies, current medications, past family history, past medical history, past social history, past surgical history and problem list.  ROS Otherwise as in subjective above  Objective: BP 128/88   Pulse 70   Ht 5\' 11"  (1.803 m)   Wt 174 lb (78.9 kg)   SpO2 97%   BMI 24.27 kg/m   General appearance: alert, no distress, well developed, well nourished HEENT: normocephalic, sclerae anicteric, conjunctiva pink and moist, TMs pearly, nares patent, no discharge or erythema, pharynx normal Oral cavity: MMM, no lesions, teeth in good repair, no obvious decay Neck: supple, no lymphadenopathy, no thyromegaly, no masses Heart: RRR, normal S1, S2, no murmurs Lungs: CTA bilaterally, no wheezes, rhonchi, or rales Pulses: 2+ radial pulses, 2+ pedal pulses, normal cap refill Ext: no edema    Assessment: Encounter Diagnoses  Name Primary?  . Vitamin D deficiency Yes  . Tobacco use   . Low serum HDL   . Leukocytosis, unspecified type   . Vaccine counseling      Plan: We discussed several recent lab findings from his physical  Vitamin D deficiency We discussed the recent lab finding, he will continue to eat fish and seafood and get sun exposure.  He is compliant with  vitamin D supplement.  Continue supplements in efforts to improve vitamin D  Tobacco cessation Continue Wellbutrin he is only been on this about a week and a half.  He will call back in a month to let me know of his success.  We discussed doing counseling along with medication.  Patient was advised to advised that [facility] has a smoking cessation group that could help with stop smoking.  Low HDL, elevated triglycerides-we discussed the lab findings.  Discussed diet and exercise including vigorous exercise to help improve HDL.  Elevated white counts and lymphocytes- Patient was advised that his white blood count was elevated and could be a sign of infection  and he reports that having dental surgery and had some issues. Patient was advised that he could have labs repeated in 4-6 weeks to monitor white blood count.   He declines flu shot.  Shingles vaccine:  I recommend you have a shingles vaccine to help prevent shingles or herpes zoster outbreak.   Please call your insurer to inquire about coverage for the Shingrix vaccine given in 2 doses.   Some insurers cover this vaccine after age 17, some cover this after age 38.  If your insurer covers this, then call to schedule appointment to have this vaccine here.   Jackson Arnold was seen today for follow-up.  Diagnoses and all orders for this visit:  Vitamin D deficiency  Tobacco use  Low serum HDL  Leukocytosis, unspecified type -     CBC with Differential/Platelet; Future  Vaccine counseling    Follow up: 4-6 weeks for CBC nurse visit and call back on tobacco cessation

## 2019-12-13 ENCOUNTER — Other Ambulatory Visit: Payer: Self-pay | Admitting: Medical

## 2019-12-25 ENCOUNTER — Other Ambulatory Visit: Payer: Self-pay | Admitting: Medical

## 2019-12-25 MED ORDER — BUPROPION HCL ER (XL) 300 MG PO TB24: 300.0000 mg | ORAL_TABLET | ORAL | 2 refills | Status: DC

## 2020-01-14 ENCOUNTER — Other Ambulatory Visit: Payer: BC Managed Care – PPO

## 2020-01-29 ENCOUNTER — Other Ambulatory Visit: Payer: Self-pay | Admitting: Medical

## 2020-02-05 DIAGNOSIS — G243 Spasmodic torticollis: Secondary | ICD-10-CM | POA: Diagnosis not present

## 2020-02-11 ENCOUNTER — Telehealth: Payer: BLUE CROSS/BLUE SHIELD | Admitting: Physician Assistant

## 2020-02-11 ENCOUNTER — Telehealth (INDEPENDENT_AMBULATORY_CARE_PROVIDER_SITE_OTHER): Payer: BC Managed Care – PPO | Admitting: Medical

## 2020-02-11 VITALS — Wt 175.0 lb

## 2020-02-11 DIAGNOSIS — M549 Dorsalgia, unspecified: Secondary | ICD-10-CM

## 2020-02-11 DIAGNOSIS — M436 Torticollis: Secondary | ICD-10-CM | POA: Diagnosis not present

## 2020-02-11 DIAGNOSIS — G8929 Other chronic pain: Secondary | ICD-10-CM

## 2020-02-11 DIAGNOSIS — M25512 Pain in left shoulder: Secondary | ICD-10-CM

## 2020-02-11 DIAGNOSIS — G479 Sleep disorder, unspecified: Secondary | ICD-10-CM

## 2020-02-11 DIAGNOSIS — M542 Cervicalgia: Secondary | ICD-10-CM | POA: Diagnosis not present

## 2020-02-11 DIAGNOSIS — M62838 Other muscle spasm: Secondary | ICD-10-CM

## 2020-02-11 MED ORDER — CYCLOBENZAPRINE HCL 10 MG PO TABS: 10.0000 mg | ORAL_TABLET | Freq: Every day | ORAL | 0 refills | Status: DC

## 2020-02-11 MED ORDER — DIAZEPAM 10 MG PO TABS: 10.0000 mg | ORAL_TABLET | Freq: Every evening | ORAL | 0 refills | Status: DC | PRN

## 2020-02-11 MED ORDER — NAPROXEN 500 MG PO TABS: 500.0000 mg | ORAL_TABLET | Freq: Two times a day (BID) | ORAL | 0 refills | Status: DC

## 2020-02-11 NOTE — Progress Notes (Signed)
Based on what you shared with me, I feel your condition warrants further evaluation and I recommend that you be seen for a face to face office visit. Since you were seen at Urgent Care on Wednesday and via Virtual visit less than an hour ago, if you are continuing to have worsening symptoms you will need a physical exam.   NOTE: If you entered your credit card information for this eVisit, you will not be charged. You may see a "hold" on your card for the $35 but that hold will drop off and you will not have a charge processed.   If you are having a true medical emergency please call 911.      For an urgent face to face visit, [facility] has five urgent care centers for your convenience:     [facility] at [facility] Directions [phone] [address] [place], [place] [address] . 10 am - 6pm Monday - Friday    [facility]) Get Driving Directions [phone] [address] [place], [place] [address] . 10 am to 8 pm Monday-Friday . 12 pm to 8 pm [facility] at [facility] Get Driving Directions [phone] [address], [address] [place], [place] [address] . 8 am to 8 pm Monday-Friday . 9 am to 6 pm Saturday . 11 am to 6 pm Sunday     [facility] at [facility] Get Driving Directions  [phone] [address] [place], [place] [address] . 8 am to 8 pm Monday-Friday . 8 am to 4 pm [facility] at [facility] Directions [phone] [address] [place], [place] [address] . 12 pm to 6 pm Monday-Friday      Your e-visit answers were reviewed by a board certified advanced clinical practitioner to complete your personal care plan.  Thank you for using e-Visits.    Greater than 5 minutes, yet less than 10 minutes of time have been spent researching, coordinating, and  implementing care for this patient today

## 2020-02-11 NOTE — Progress Notes (Signed)
Subjective:     Patient ID: Jackson Arnold, male   DOB: 1965-05-12, 54 y.o.   MRN: [id number]  This visit type was conducted due to national recommendations for restrictions regarding the COVID-19 Pandemic (e.g. social distancing) in an effort to limit this patient's exposure and mitigate transmission in our community.  Due to their co-morbid illnesses, this patient is at least at moderate risk for complications without adequate follow up.  This format is felt to be most appropriate for this patient at this time.    Documentation for virtual audio and video telecommunications through [place] encounter:  The patient was located at home. The provider was located in the office. The patient did consent to this visit and is aware of possible charges through their insurance for this visit.  The other persons participating in this telemedicine service were none. Time spent on call was 20 minutes and in review of previous records 20 minutes total.  This virtual service is not related to other E/M service within previous 7 days.   HPI Chief Complaint  Patient presents with  . Follow-up    Neck spasms, not sleeping at night. Follow-up on urgent care from [facility] consult for neck issues.   He notes ongoing problems with neck.   Usually happens when he gets a cold.  Had a cold 2 weeks ago.   When he gets colds, he gets torticollis which he has had recently.    Sometimes gets so bad , head turns to one side.   Is using flexeril and had a toradol shot 6 days ago.  Sometimes gets this 5 times per year.    Last week went to urgent care in [place] for this.    Within past week had stiff neck, couldn't move fully to left, left arm ROM was reduced with some pain in left upper arm.  Has had some left shoulder pains as well.   No injury or trauma.  Is right handed.  In past 2 years hasn't seen therapy such as chiropractor or PT.   In the past just dealt with pain and used medications.  He is  right-handed.  He is seeing some improvement this week, last week was worse.  Having trouble sleeping as he still is waking up spasm and pain  He has ongoing pains in his left shoulder on and off.  No other aggravating or relieving factors. No other complaint.    Past Medical History:  Diagnosis Date  . Bronchitis   . Chest pain 06/2011   ED/observation visit  . H/O CT scan 06/2011   cardiac, normal  . History of EKG 5/13   sinus bradycardia  . Hyperlipidemia    tricor prior  . Pulmonary sarcoidosis (HCC) 1998  . Tobacco use   . Vitamin D deficiency      Review of Systems As in subjective    Objective:   Physical Exam Due to coronavirus pandemic stay at home measures, patient visit was virtual and they were not examined in person.   Wt 175 lb (79.4 kg)   BMI 24.41 kg/m  General: Well-developed well-nourished no acute stress Decreased left neck range of motion with left rotation, slightly reduced in other planes but mainly left rotation He seems to have reduced range of motion left shoulder with flexion above 100 degrees and decreased internal and external range of motion, otherwise range of motion normal, no step-off deformity of either shoulder     Assessment:  Encounter Diagnoses  Name Primary?  . Torticollis Yes  . Neck pain   . Chronic left shoulder pain   . Muscle spasm   . Sleep disturbance        Plan:     Given his chronic neck pain and shoulder pain and intermittent torticollis and current flareup, we will go for x-ray as below, we will refer to physical therapy including consideration of TENS unit  Begin medications Naprosyn for the next 3 to 5 days,Valium for the next 2 days then go back to Flexeril as needed.  Were going to see if the Valium will help with sleep and spasm  We discussed other ways to manage this including a regular stretching routine, heat and massage, we discussed the benefits of physical therapy.  He does not swim but we did  discuss lap swimming as a therapy  Jovaughn was seen today for follow-up.  Diagnoses and all orders for this visit:  Torticollis -     DG Cervical Spine Complete; Future -     DG Shoulder Left; Future -     Ambulatory referral to Physical Therapy  Neck pain -     DG Cervical Spine Complete; Future -     DG Shoulder Left; Future -     Ambulatory referral to Physical Therapy  Chronic left shoulder pain -     DG Cervical Spine Complete; Future -     DG Shoulder Left; Future -     Ambulatory referral to Physical Therapy  Muscle spasm -     DG Cervical Spine Complete; Future -     DG Shoulder Left; Future -     Ambulatory referral to Physical Therapy  Sleep disturbance -     DG Cervical Spine Complete; Future -     DG Shoulder Left; Future -     Ambulatory referral to Physical Therapy  Other orders -     diazepam (VALIUM) 10 MG tablet; Take 1 tablet (10 mg total) by mouth at bedtime as needed for anxiety. Short term instead of flexeril -     cyclobenzaprine (FLEXERIL) 10 MG tablet; Take 1 tablet (10 mg total) by mouth at bedtime. -     naproxen (NAPROSYN) 500 MG tablet; Take 1 tablet (500 mg total) by mouth 2 (two) times daily with a meal.  Follow-up pending referral and x-rays

## 2020-02-13 ENCOUNTER — Other Ambulatory Visit: Payer: Self-pay | Admitting: Medical

## 2020-03-13 ENCOUNTER — Other Ambulatory Visit: Payer: Self-pay | Admitting: Medical

## 2020-04-22 ENCOUNTER — Other Ambulatory Visit: Payer: Self-pay | Admitting: Medical

## 2020-05-14 DIAGNOSIS — J310 Chronic rhinitis: Secondary | ICD-10-CM | POA: Diagnosis not present

## 2020-05-14 DIAGNOSIS — J343 Hypertrophy of nasal turbinates: Secondary | ICD-10-CM | POA: Diagnosis not present

## 2020-11-09 ENCOUNTER — Other Ambulatory Visit: Payer: Self-pay

## 2020-11-09 ENCOUNTER — Encounter: Payer: Self-pay | Admitting: Medical

## 2020-11-09 ENCOUNTER — Ambulatory Visit (INDEPENDENT_AMBULATORY_CARE_PROVIDER_SITE_OTHER): Payer: BC Managed Care – PPO | Admitting: Medical

## 2020-11-09 VITALS — BP 120/80 | HR 65 | Wt 175.0 lb

## 2020-11-09 DIAGNOSIS — Z862 Personal history of diseases of the blood and blood-forming organs and certain disorders involving the immune mechanism: Secondary | ICD-10-CM

## 2020-11-09 DIAGNOSIS — E785 Hyperlipidemia, unspecified: Secondary | ICD-10-CM | POA: Diagnosis not present

## 2020-11-09 DIAGNOSIS — D72829 Elevated white blood cell count, unspecified: Secondary | ICD-10-CM | POA: Diagnosis not present

## 2020-11-09 DIAGNOSIS — Z720 Tobacco use: Secondary | ICD-10-CM

## 2020-11-09 DIAGNOSIS — J329 Chronic sinusitis, unspecified: Secondary | ICD-10-CM

## 2020-11-09 DIAGNOSIS — Z2821 Immunization not carried out because of patient refusal: Secondary | ICD-10-CM

## 2020-11-09 DIAGNOSIS — R748 Abnormal levels of other serum enzymes: Secondary | ICD-10-CM

## 2020-11-09 DIAGNOSIS — E559 Vitamin D deficiency, unspecified: Secondary | ICD-10-CM

## 2020-11-09 DIAGNOSIS — Z23 Encounter for immunization: Secondary | ICD-10-CM | POA: Diagnosis not present

## 2020-11-09 DIAGNOSIS — Z0000 Encounter for general adult medical examination without abnormal findings: Secondary | ICD-10-CM

## 2020-11-09 DIAGNOSIS — K635 Polyp of colon: Secondary | ICD-10-CM

## 2020-11-09 DIAGNOSIS — R059 Cough, unspecified: Secondary | ICD-10-CM | POA: Insufficient documentation

## 2020-11-09 DIAGNOSIS — Z125 Encounter for screening for malignant neoplasm of prostate: Secondary | ICD-10-CM | POA: Diagnosis not present

## 2020-11-09 DIAGNOSIS — Z7185 Encounter for immunization safety counseling: Secondary | ICD-10-CM

## 2020-11-09 NOTE — Patient Instructions (Addendum)
This visit was a preventative care visit, also known as wellness visit or routine physical.   Topics typically include healthy lifestyle, diet, exercise, preventative care, vaccinations, sick and well care, proper use of emergency dept and after hours care, as well as other concerns.     Recommendations: Continue to return yearly for your annual wellness and preventative care visits.  This gives [place] a chance to discuss healthy lifestyle, exercise, vaccinations, review your chart record, and perform screenings where appropriate.  I recommend you see your eye doctor yearly for routine vision care.  I recommend you see your dentist yearly for routine dental care including hygiene visits twice yearly.   Vaccination recommendations were reviewed Immunization History  Administered Date(s) Administered   [employer] Vaccination 03/06/2019, 09/06/2019, 10/04/2019   Pneumococcal Polysaccharide-23 10/10/2012   Tdap 11/07/2019    You declined flu shot today  Counseled on the pneumococcal vaccine.  Vaccine information sheet given.  Pneumococcal vaccine PPSV23 given after consent obtained.   Screening for cancer: Colon cancer screening: I reviewed your colonoscopy on file that is up to date from 2019.  You are due repeat 2024  We discussed PSA, prostate exam, and prostate cancer screening risks/benefits.     Skin cancer screening: Check your skin regularly for new changes, growing lesions, or other lesions of concern Come in for evaluation if you have skin lesions of concern.  Lung cancer screening: If you have a greater than 20 pack year history of tobacco use, then you may qualify for lung cancer screening with a chest CT scan.   Please call your insurance company to inquire about coverage for this test.  We currently don't have screenings for other cancers besides breast, cervical, colon, and lung cancers.  If you have a strong family history of cancer or have other cancer  screening concerns, please let me know.    Bone health: Get at least 150 minutes of aerobic exercise weekly Get weight bearing exercise at least once weekly Bone density test:  A bone density test is an imaging test that uses a type of X-ray to measure the amount of calcium and other minerals in your bones. The test may be used to diagnose or screen you for a condition that causes weak or thin bones (osteoporosis), predict your risk for a broken bone (fracture), or determine how well your osteoporosis treatment is working. The bone density test is recommended for females 70 and older, or females or males <69 if certain risk factors such as thyroid disease, long term use of steroids such as for asthma or rheumatological issues, vitamin D deficiency, estrogen deficiency, family history of osteoporosis, self or family history of fragility fracture in first degree relative.    Heart health: Get at least 150 minutes of aerobic exercise weekly Limit alcohol It is important to maintain a healthy blood pressure and healthy cholesterol numbers  Heart disease screening: Screening for heart disease includes screening for blood pressure, fasting lipids, glucose/diabetes screening, BMI height to weight ratio, reviewed of smoking status, physical activity, and diet.    Goals include blood pressure 120/80 or less, maintaining a healthy lipid/cholesterol profile, preventing diabetes or keeping diabetes numbers under good control, not smoking or using tobacco products, exercising most days per week or at least 150 minutes per week of exercise, and eating healthy variety of fruits and vegetables, healthy oils, and avoiding unhealthy food choices like fried food, fast food, high sugar and high cholesterol foods.    Other tests may  possibly include EKG test, CT coronary calcium score, echocardiogram, exercise treadmill stress test.    Medical care options: I recommend you continue to seek care here first for  routine care.  We try really hard to have available appointments Monday through Friday daytime hours for sick visits, acute visits, and physicals.  Urgent care should be used for after hours and weekends for significant issues that cannot wait till the next day.  The emergency department should be used for significant potentially life-threatening emergencies.  The emergency department is expensive, can often have long wait times for less significant concerns, so try to utilize primary care, urgent care, or telemedicine when possible to avoid unnecessary trips to the emergency department.  Virtual visits and telemedicine have been introduced since the pandemic started in 2020, and can be convenient ways to receive medical care.  We offer virtual appointments as well to assist you in a variety of options to seek medical care.     Separate significant issues discussed: History of sarcoidosis, continues to smoke-updated PFT today and will send for CT chest lung cancer screening.  Strongly recommended he quit smoking.  He tried Wellbutrin in the past but could not completely quit smoking  Hyperlipidemia-continue current medication, updated labs today  Vitamin D deficiency-updated labs today, continue supplement  Chronic congestion, cough-likely due to long-term tobacco use and possibly allergies as well.  Advised to quit smoking.  Consider daily antihistamine such as zyrtec at bedtime, and consider short term mucinex for 3-5 days at a time when worse congestion.  Leukocytosis on prior labs-updated labs today

## 2020-11-09 NOTE — Addendum Note (Signed)
Addended by: Minette Headland A on: 11/09/2020 10:11 AM   Modules accepted: Orders

## 2020-11-09 NOTE — Progress Notes (Signed)
Subjective:   HPI  Jackson Arnold is a 55 y.o. male who presents for Chief Complaint  Patient presents with   fasting cpe    Fasting cpe, no concerns, declines flu shot    Patient Care Team: Kenyana Husak, Camelia Eng, PA-C as PCP - General (Family Medicine) Sees dentist Sees eye doctor  Concerns: Here for physical  Throat feels funny, has more drainage than usual, some cough x 2 weeks, using OTC primetine mist.  Has felt funny in throat ever since getting 3rd Moderna vaccine in March.  Has had prior sinus surgery, gets more sinus drainage than before the surgery.  Still smokes.  Has hx/o sarcoidosis.    Otherwise in normal state of health.  Reviewed their medical, surgical, family, social, medication, and allergy history and updated chart as appropriate.  Past Medical History:  Diagnosis Date   Bronchitis    Chest pain 06/2011   ED/observation visit   H/O CT scan 06/2011   cardiac, normal   History of EKG 5/13   sinus bradycardia   Hyperlipidemia    tricor prior   Pulmonary sarcoidosis ([place]) 1998   Tobacco use    Vitamin D deficiency     Past Surgical History:  Procedure Laterality Date   COLONOSCOPY  05/2017   tubular adenoma, polyps, repeat 2024; Dr. Havery Moros   LUNG BIOPSY  1998   TONSILLECTOMY      Family History  Problem Relation Age of Onset   Diabetes Mother    Cancer Mother        skin, lymphoma   Lung cancer Father    Cancer Maternal Aunt        colon   Colon cancer Maternal Aunt    Stroke Paternal Grandmother    Hypertension Neg Hx    Esophageal cancer Neg Hx    Liver cancer Neg Hx    Pancreatic cancer Neg Hx    Prostate cancer Neg Hx    Rectal cancer Neg Hx    Stomach cancer Neg Hx      Current Outpatient Medications:    atorvastatin (LIPITOR) 20 MG tablet, atorvastatin 20 mg tablet, Disp: 90 tablet, Rfl: 3   cholecalciferol (VITAMIN D3) 25 MCG (1000 UNIT) tablet, Take 1 tablet (1,000 Units total) by mouth daily., Disp: 90 tablet, Rfl:  3  Allergies  Allergen Reactions   Tramadol Nausea And Vomiting     Review of Systems Constitutional: -fever, -chills, -sweats, -unexpected weight change, -decreased appetite, -fatigue Allergy: -sneezing, -itching, -congestion Dermatology: -changing moles, --rash, -lumps ENT: -runny nose, -ear pain, -sore throat, -hoarseness, -sinus pain, -teeth pain, - ringing in ears, -hearing loss, -nosebleeds Cardiology: -chest pain, -palpitations, -swelling, -difficulty breathing when lying flat, -waking up short of breath Respiratory: -cough, -shortness of breath, -difficulty breathing with exercise or exertion, -wheezing, -coughing up blood Gastroenterology: -abdominal pain, -nausea, -vomiting, -diarrhea, -constipation, -blood in stool, -changes in bowel movement, -difficulty swallowing or eating Hematology: -bleeding, -bruising  Musculoskeletal: -joint aches, -muscle aches, -joint swelling, -back pain, -neck pain, -cramping, -changes in gait Ophthalmology: denies vision changes, eye redness, itching, discharge Urology: -burning with urination, -difficulty urinating, -blood in urine, -urinary frequency, -urgency, -incontinence Neurology: -headache, -weakness, -tingling, -numbness, -memory loss, -falls, -dizziness Psychology: -depressed mood, -agitation, -sleep problems Male GU: no testicular mass, pain, no lymph nodes swollen, no swelling, no rash.  Depression screen [facility] 2/9 11/09/2020 11/07/2019 11/06/2018 08/14/2017  Decreased Interest 0 0 0 0  Down, Depressed, Hopeless 0 0 0 0  PHQ - 2 Score  0 0 0 0        Objective:  BP 120/80   Pulse 65   Wt 175 lb (79.4 kg)   BMI 24.41 kg/m   General appearance: alert, no distress, WD/WN, African American male Skin: unremarkable HEENT: normocephalic, conjunctiva/corneas normal, sclerae anicteric, PERRLA, EOMi, nares patent, no discharge or erythema, pharynx normal Oral cavity: MMM, tongue normal, teeth in good repair Neck: supple, no  lymphadenopathy, no thyromegaly, no masses, normal ROM, no bruits Chest: non tender, normal shape and expansion Heart: RRR, normal S1, S2, no murmurs Lungs: CTA bilaterally, no wheezes, rhonchi, or rales Abdomen: +bs, soft, non tender, non distended, no masses, no hepatomegaly, no splenomegaly, no bruits Back: non tender, normal ROM, no scoliosis Musculoskeletal: upper extremities non tender, no obvious deformity, normal ROM throughout, lower extremities non tender, no obvious deformity, normal ROM throughout Extremities: no edema, no cyanosis, no clubbing Pulses: 2+ symmetric, upper and lower extremities, normal cap refill Neurological: alert, oriented x 3, CN2-12 intact, strength normal upper extremities and lower extremities, sensation normal throughout, DTRs 2+ throughout, no cerebellar signs, gait normal Psychiatric: normal affect, behavior normal, pleasant  GU: normal male external genitalia,circumcised, nontender, no masses, no hernia, no lymphadenopathy Rectal: deferred   Assessment and Plan :   Encounter Diagnoses  Name Primary?   Encounter for health maintenance examination in adult Yes   Routine general medical examination at a health care facility    Hyperlipidemia, unspecified hyperlipidemia type    History of sarcoidosis    Chronic sinusitis, unspecified location    Polyp of colon, unspecified part of colon, unspecified type    Influenza vaccination declined    Low serum HDL    Leukocytosis, unspecified type    Screening for prostate cancer    Tobacco use    Vaccine counseling    Vitamin D deficiency    Need for pneumococcal vaccination    Cough     This visit was a preventative care visit, also known as wellness visit or routine physical.   Topics typically include healthy lifestyle, diet, exercise, preventative care, vaccinations, sick and well care, proper use of emergency dept and after hours care, as well as other concerns.     Recommendations: Continue to  return yearly for your annual wellness and preventative care visits.  This gives [place] a chance to discuss healthy lifestyle, exercise, vaccinations, review your chart record, and perform screenings where appropriate.  I recommend you see your eye doctor yearly for routine vision care.  I recommend you see your dentist yearly for routine dental care including hygiene visits twice yearly.   Vaccination recommendations were reviewed Immunization History  Administered Date(s) Administered   [employer] Vaccination 03/06/2019, 09/06/2019, 10/04/2019   Pneumococcal Polysaccharide-23 10/10/2012   Tdap 11/07/2019    You declined flu shot today  Counseled on the pneumococcal vaccine.  Vaccine information sheet given.  Pneumococcal vaccine PPSV23 given after consent obtained.   Screening for cancer: Colon cancer screening: I reviewed your colonoscopy on file that is up to date from 2019.  You are due repeat 2024  We discussed PSA, prostate exam, and prostate cancer screening risks/benefits.     Skin cancer screening: Check your skin regularly for new changes, growing lesions, or other lesions of concern Come in for evaluation if you have skin lesions of concern.  Lung cancer screening: If you have a greater than 20 pack year history of tobacco use, then you may qualify for lung cancer screening with a chest  CT scan.   Please call your insurance company to inquire about coverage for this test.  We currently don't have screenings for other cancers besides breast, cervical, colon, and lung cancers.  If you have a strong family history of cancer or have other cancer screening concerns, please let me know.    Bone health: Get at least 150 minutes of aerobic exercise weekly Get weight bearing exercise at least once weekly Bone density test:  A bone density test is an imaging test that uses a type of X-ray to measure the amount of calcium and other minerals in your bones. The test may  be used to diagnose or screen you for a condition that causes weak or thin bones (osteoporosis), predict your risk for a broken bone (fracture), or determine how well your osteoporosis treatment is working. The bone density test is recommended for females 29 and older, or females or males <45 if certain risk factors such as thyroid disease, long term use of steroids such as for asthma or rheumatological issues, vitamin D deficiency, estrogen deficiency, family history of osteoporosis, self or family history of fragility fracture in first degree relative.    Heart health: Get at least 150 minutes of aerobic exercise weekly Limit alcohol It is important to maintain a healthy blood pressure and healthy cholesterol numbers  Heart disease screening: Screening for heart disease includes screening for blood pressure, fasting lipids, glucose/diabetes screening, BMI height to weight ratio, reviewed of smoking status, physical activity, and diet.    Goals include blood pressure 120/80 or less, maintaining a healthy lipid/cholesterol profile, preventing diabetes or keeping diabetes numbers under good control, not smoking or using tobacco products, exercising most days per week or at least 150 minutes per week of exercise, and eating healthy variety of fruits and vegetables, healthy oils, and avoiding unhealthy food choices like fried food, fast food, high sugar and high cholesterol foods.    Other tests may possibly include EKG test, CT coronary calcium score, echocardiogram, exercise treadmill stress test.    Medical care options: I recommend you continue to seek care here first for routine care.  We try really hard to have available appointments Monday through Friday daytime hours for sick visits, acute visits, and physicals.  Urgent care should be used for after hours and weekends for significant issues that cannot wait till the next day.  The emergency department should be used for significant  potentially life-threatening emergencies.  The emergency department is expensive, can often have long wait times for less significant concerns, so try to utilize primary care, urgent care, or telemedicine when possible to avoid unnecessary trips to the emergency department.  Virtual visits and telemedicine have been introduced since the pandemic started in 2020, and can be convenient ways to receive medical care.  We offer virtual appointments as well to assist you in a variety of options to seek medical care.     Separate significant issues discussed: History of sarcoidosis, continues to smoke-updated PFT today and will send for CT chest lung cancer screening.  Strongly recommended he quit smoking.  He tried Wellbutrin in the past but could not completely quit smoking  Hyperlipidemia-continue current medication, updated labs today  Vitamin D deficiency-updated labs today, continue supplement  Chronic congestion, cough-likely due to long-term tobacco use and possibly allergies as well.  Advised to quit smoking.  Consider daily antihistamine such as zyrtec at bedtime, and consider short term mucinex for 3-5 days at a time when worse congestion  Leukocytosis  on prior labs-updated labs today    K[place]on was seen today for fasting cpe.  Diagnoses and all orders for this visit:  Encounter for health maintenance examination in adult -     Comprehensive metabolic panel -     CBC with Differential/Platelet -     Lipid panel -     PSA -     VITAMIN D 25 Hydroxy (Vit-D Deficiency, Fractures) -     Pulmonary Function Test  Routine general medical examination at a health care facility -     CT CHEST LUNG CA SCREEN LOW DOSE W/O CM; Future -     Urinalysis  Hyperlipidemia, unspecified hyperlipidemia type -     Lipid panel  History of sarcoidosis -     Pulmonary Function Test -     CT CHEST LUNG CA SCREEN LOW DOSE W/O CM; Future  Chronic sinusitis, unspecified location  Polyp of colon,  unspecified part of colon, unspecified type  Influenza vaccination declined  Low serum HDL -     Lipid panel  Leukocytosis, unspecified type -     CBC with Differential/Platelet  Screening for prostate cancer -     PSA  Tobacco use -     CT CHEST LUNG CA SCREEN LOW DOSE W/O CM; Future  Vaccine counseling  Vitamin D deficiency -     VITAMIN D 25 Hydroxy (Vit-D Deficiency, Fractures)  Need for pneumococcal vaccination  Cough -     Pulmonary Function Test  Other orders -     Pneumococcal polysaccharide vaccine 23-valent greater than or equal to 2yo subcutaneous/IM    Follow-up pending labs, yearly for physical

## 2020-11-10 ENCOUNTER — Other Ambulatory Visit: Payer: Self-pay | Admitting: Medical

## 2020-11-10 LAB — COMPREHENSIVE METABOLIC PANEL
ALT: 21 IU/L (ref 0–44)
AST: 19 IU/L (ref 0–40)
Albumin/Globulin Ratio: 2.2 (ref 1.2–2.2)
Albumin: 4.9 g/dL (ref 3.8–4.9)
Alkaline Phosphatase: 98 IU/L (ref 44–121)
BUN/Creatinine Ratio: 11 (ref 9–20)
BUN: 10 mg/dL (ref 6–24)
Bilirubin Total: 0.4 mg/dL (ref 0.0–1.2)
CO2: 24 mmol/L (ref 20–29)
Calcium: 10.4 mg/dL — ABNORMAL HIGH (ref 8.7–10.2)
Chloride: 102 mmol/L (ref 96–106)
Creatinine, Ser: 0.89 mg/dL (ref 0.76–1.27)
Globulin, Total: 2.2 g/dL (ref 1.5–4.5)
Glucose: 81 mg/dL (ref 65–99)
Potassium: 4.5 mmol/L (ref 3.5–5.2)
Sodium: 140 mmol/L (ref 134–144)
Total Protein: 7.1 g/dL (ref 6.0–8.5)
eGFR: 102 mL/min/{1.73_m2} (ref >59)

## 2020-11-10 LAB — LIPID PANEL
Chol/HDL Ratio: 5.3 ratio — ABNORMAL HIGH (ref 0.0–5.0)
Cholesterol, Total: 168 mg/dL (ref 100–199)
HDL: 32 mg/dL — ABNORMAL LOW (ref >39)
LDL Chol Calc (NIH): 94 mg/dL (ref 0–99)
Triglycerides: 248 mg/dL — ABNORMAL HIGH (ref 0–149)
VLDL Cholesterol Cal: 42 mg/dL — ABNORMAL HIGH (ref 5–40)

## 2020-11-10 LAB — URINALYSIS
Bilirubin, UA: NEGATIVE
Glucose, UA: NEGATIVE
Ketones, UA: NEGATIVE
Leukocytes,UA: NEGATIVE
Nitrite, UA: NEGATIVE
Protein,UA: NEGATIVE
RBC, UA: NEGATIVE
Specific Gravity, UA: 1.009 (ref 1.005–1.030)
Urobilinogen, Ur: 0.2 mg/dL (ref 0.2–1.0)
pH, UA: 6.5 (ref 5.0–7.5)

## 2020-11-10 LAB — CBC WITH DIFFERENTIAL/PLATELET
Basophils Absolute: 0.1 10*3/uL (ref 0.0–0.2)
Basos: 1 %
EOS (ABSOLUTE): 0.4 10*3/uL (ref 0.0–0.4)
Eos: 3 %
Hematocrit: 49.9 % (ref 37.5–51.0)
Hemoglobin: 17.2 g/dL (ref 13.0–17.7)
Immature Grans (Abs): 0 10*3/uL (ref 0.0–0.1)
Immature Granulocytes: 0 %
Lymphocytes Absolute: 4.6 10*3/uL — ABNORMAL HIGH (ref 0.7–3.1)
Lymphs: 36 %
MCH: 30.3 pg (ref 26.6–33.0)
MCHC: 34.5 g/dL (ref 31.5–35.7)
MCV: 88 fL (ref 79–97)
Monocytes Absolute: 1.1 10*3/uL — ABNORMAL HIGH (ref 0.1–0.9)
Monocytes: 9 %
Neutrophils Absolute: 6.7 10*3/uL (ref 1.4–7.0)
Neutrophils: 51 %
Platelets: 298 10*3/uL (ref 150–450)
RBC: 5.68 x10E6/uL (ref 4.14–5.80)
RDW: 12.6 % (ref 11.6–15.4)
WBC: 12.9 10*3/uL — ABNORMAL HIGH (ref 3.4–10.8)

## 2020-11-10 LAB — PSA: Prostate Specific Ag, Serum: 0.7 ng/mL (ref 0.0–4.0)

## 2020-11-10 LAB — VITAMIN D 25 HYDROXY (VIT D DEFICIENCY, FRACTURES): Vit D, 25-Hydroxy: 65.1 ng/mL (ref 30.0–100.0)

## 2020-11-10 MED ORDER — ATORVASTATIN CALCIUM 20 MG PO TABS: ORAL_TABLET | ORAL | 3 refills | Status: DC

## 2020-11-10 MED ORDER — GEMFIBROZIL 600 MG PO TABS: 600.0000 mg | ORAL_TABLET | Freq: Two times a day (BID) | ORAL | 4 refills | Status: DC

## 2020-11-25 ENCOUNTER — Other Ambulatory Visit: Payer: Self-pay

## 2020-11-25 ENCOUNTER — Ambulatory Visit
Admission: RE | Admit: 2020-11-25 | Discharge: 2020-11-25 | Disposition: A | Discharge status: Home / Self Care | Payer: Self-pay | Source: Ambulatory Visit | Attending: Medical | Admitting: Medical

## 2020-11-25 DIAGNOSIS — Z87891 Personal history of nicotine dependence: Secondary | ICD-10-CM | POA: Diagnosis not present

## 2020-11-25 DIAGNOSIS — Z862 Personal history of diseases of the blood and blood-forming organs and certain disorders involving the immune mechanism: Secondary | ICD-10-CM

## 2020-11-25 DIAGNOSIS — Z720 Tobacco use: Secondary | ICD-10-CM

## 2020-11-25 DIAGNOSIS — Z0000 Encounter for general adult medical examination without abnormal findings: Secondary | ICD-10-CM

## 2021-02-11 ENCOUNTER — Other Ambulatory Visit: Payer: Self-pay | Admitting: Medical

## 2021-03-15 ENCOUNTER — Other Ambulatory Visit: Payer: Self-pay

## 2021-03-15 ENCOUNTER — Ambulatory Visit (INDEPENDENT_AMBULATORY_CARE_PROVIDER_SITE_OTHER): Payer: BC Managed Care – PPO | Admitting: Medical

## 2021-03-15 VITALS — BP 120/80 | HR 62 | Wt 176.6 lb

## 2021-03-15 DIAGNOSIS — E785 Hyperlipidemia, unspecified: Secondary | ICD-10-CM

## 2021-03-15 DIAGNOSIS — J329 Chronic sinusitis, unspecified: Secondary | ICD-10-CM

## 2021-03-15 DIAGNOSIS — Z720 Tobacco use: Secondary | ICD-10-CM | POA: Diagnosis not present

## 2021-03-15 DIAGNOSIS — E559 Vitamin D deficiency, unspecified: Secondary | ICD-10-CM

## 2021-03-15 DIAGNOSIS — D72829 Elevated white blood cell count, unspecified: Secondary | ICD-10-CM | POA: Diagnosis not present

## 2021-03-15 DIAGNOSIS — Z122 Encounter for screening for malignant neoplasm of respiratory organs: Secondary | ICD-10-CM

## 2021-03-15 DIAGNOSIS — Z862 Personal history of diseases of the blood and blood-forming organs and certain disorders involving the immune mechanism: Secondary | ICD-10-CM

## 2021-03-15 NOTE — Progress Notes (Signed)
Subjective:  Jackson Arnold is a 56 y.o. male who presents for Chief Complaint  Patient presents with   follow-up    Follow-up on white blood cell count. Declines flu shot     Here today for follow-up from his physical in September 2022.  At his physical in September 2022 he was on statin but we added Lopid to help with his triglycerides.  He is compliant with both medicines without complaint  He does continue to smoke.  This has been a very hard thing to quit despite efforts  Last visit he had elevated blood counts, elevated calcium.  He notes the last couple years he was dealing with chronic sinus problems that finally resolved.  He attributes the elevated white cells likely to ongoing sinus problems in the last couple years.  He also gets some inflammation and pains in his right shoulder and arm ongoing and thinks this could also contribute to the labs showing inflammation.  He currently denies fever, lymph nodes enlarged, no recent illness symptoms, feeling pretty healthy  He is working 84 hours/week currently the last several weeks and doing okay physically keeping up with those demands  History of vitamin D deficiency-she is still taking vitamin D supplement.  Of note my lab notes from September showed that we asked him to stop the vitamin D.  He does not recall that recommendation  No other aggravating or relieving factors.    No other c/o.  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     The following portions of the patient's history were reviewed and updated as appropriate: allergies, current medications, past family history, past medical history, past social history, past surgical history and problem list.  ROS Otherwise as in subjective above  Objective: BP 120/80    Pulse 62    Wt 176 lb 9.6 oz (80.1 kg)    BMI 24.63 kg/m   General appearance: alert, no distress, well developed, well nourished Otherwise not examined    Assessment: Encounter Diagnoses  Name Primary?   Hyperlipidemia, unspecified hyperlipidemia type Yes   Leukocytosis, unspecified type    Vitamin D deficiency    Tobacco use    Serum calcium elevated    History of sarcoidosis    Screening for lung cancer    Chronic sinusitis, unspecified location      Plan: Leukocytosis on several labs in the past year, only slightly elevated white counts.  Possibly related to chronic inflammation versus chronic sinus issues versus tobacco use.  Recheck labs today  History of sarcoidosis-recent CT chest from October 2022 did not show any lymph node enlargement, lung nodules or other abnormalities other than emphysema.  He does not have any emphysema symptoms currently.  He is trying to quit smoking  Hyperlipidemia-updated labs today since he has been on statin plus  adding gemfibrozil last visit.  Tobacco use-continue efforts to quit smoking.  Consider 100 quit now hotline  Elevated calcium on last labs.  This is possibly due to vitamin D supplementation.  Repeat labs today.  He is continuing to take vitamin D currently although we had sent a lab message in September to discontinue vitamin D for the time being.  He apparently did not get this message  Screening for lung cancer-I reviewed his October 2022 CT chest.  Plan to repeat this October 2023  Chronic sinusitis, resolved  Mickel was seen today for follow-up.  Diagnoses  and all orders for this visit:  Hyperlipidemia, unspecified hyperlipidemia type -     Lipid panel -     Comprehensive metabolic panel  Leukocytosis, unspecified type -     CBC -     Pathologist smear review -     Sedimentation rate  Vitamin D deficiency  Tobacco use -     Sedimentation rate  Serum calcium elevated -     Comprehensive metabolic panel  History of sarcoidosis -     Sedimentation rate  Screening for lung cancer  Chronic sinusitis, unspecified location -     CBC    Follow up: pending labs

## 2021-03-18 LAB — PATHOLOGIST SMEAR REVIEW
Basophils Absolute: 0.1 10*3/uL (ref 0.0–0.2)
Basos: 1 %
EOS (ABSOLUTE): 0.3 10*3/uL (ref 0.0–0.4)
Eos: 3 %
Hematocrit: 47.8 % (ref 37.5–51.0)
Hemoglobin: 16.5 g/dL (ref 13.0–17.7)
Immature Grans (Abs): 0 10*3/uL (ref 0.0–0.1)
Immature Granulocytes: 0 %
Lymphocytes Absolute: 4.2 10*3/uL — ABNORMAL HIGH (ref 0.7–3.1)
Lymphs: 41 %
MCH: 30.1 pg (ref 26.6–33.0)
MCHC: 34.5 g/dL (ref 31.5–35.7)
MCV: 87 fL (ref 79–97)
Monocytes Absolute: 0.9 10*3/uL (ref 0.1–0.9)
Monocytes: 9 %
Neutrophils Absolute: 4.7 10*3/uL (ref 1.4–7.0)
Neutrophils: 46 %
Platelets: 294 10*3/uL (ref 150–450)
RBC: 5.49 x10E6/uL (ref 4.14–5.80)
RDW: 12 % (ref 11.6–15.4)
WBC: 10.3 10*3/uL (ref 3.4–10.8)

## 2021-03-18 LAB — COMPREHENSIVE METABOLIC PANEL
ALT: 26 IU/L (ref 0–44)
AST: 18 IU/L (ref 0–40)
Albumin/Globulin Ratio: 2.1 (ref 1.2–2.2)
Albumin: 4.8 g/dL (ref 3.8–4.9)
Alkaline Phosphatase: 93 IU/L (ref 44–121)
BUN/Creatinine Ratio: 11 (ref 9–20)
BUN: 10 mg/dL (ref 6–24)
Bilirubin Total: 0.3 mg/dL (ref 0.0–1.2)
CO2: 23 mmol/L (ref 20–29)
Calcium: 10.2 mg/dL (ref 8.7–10.2)
Chloride: 103 mmol/L (ref 96–106)
Creatinine, Ser: 0.92 mg/dL (ref 0.76–1.27)
Globulin, Total: 2.3 g/dL (ref 1.5–4.5)
Glucose: 87 mg/dL (ref 70–99)
Potassium: 4.4 mmol/L (ref 3.5–5.2)
Sodium: 140 mmol/L (ref 134–144)
Total Protein: 7.1 g/dL (ref 6.0–8.5)
eGFR: 99 mL/min/{1.73_m2} (ref >59)

## 2021-03-18 LAB — LIPID PANEL
Chol/HDL Ratio: 5 ratio (ref 0.0–5.0)
Cholesterol, Total: 166 mg/dL (ref 100–199)
HDL: 33 mg/dL — ABNORMAL LOW (ref >39)
LDL Chol Calc (NIH): 109 mg/dL — ABNORMAL HIGH (ref 0–99)
Triglycerides: 130 mg/dL (ref 0–149)
VLDL Cholesterol Cal: 24 mg/dL (ref 5–40)

## 2021-03-18 LAB — SEDIMENTATION RATE: Sed Rate: 2 mm/hr (ref 0–30)

## 2021-05-08 ENCOUNTER — Other Ambulatory Visit: Payer: Self-pay | Admitting: Medical

## 2021-08-08 ENCOUNTER — Other Ambulatory Visit: Payer: Self-pay | Admitting: Medical

## 2021-08-13 ENCOUNTER — Ambulatory Visit: Payer: BC Managed Care – PPO | Admitting: Medical

## 2021-08-13 VITALS — BP 120/78 | HR 78 | Temp 97.5°F | Wt 175.8 lb

## 2021-08-13 DIAGNOSIS — L290 Pruritus ani: Secondary | ICD-10-CM

## 2021-08-13 DIAGNOSIS — K644 Residual hemorrhoidal skin tags: Secondary | ICD-10-CM | POA: Diagnosis not present

## 2021-08-13 MED ORDER — HYDROCORTISONE 2.5 % EX CREA: TOPICAL_CREAM | Freq: Two times a day (BID) | CUTANEOUS | 2 refills | Status: AC

## 2021-08-13 NOTE — Patient Instructions (Signed)
Hemorrhoids: I prescribed Hydrocortisone 2.5% cream today.  You can apply this topically to the external hemorrhoid twice daily short term for 3-5 days at a time as needed for itching, irritations, or swelling    Hemorrhoids Hemorrhoids are swollen veins in and around the rectum or anus. There are two types of hemorrhoids: Internal hemorrhoids. These occur in the veins that are just inside the rectum. They may poke through to the outside and become irritated and painful. External hemorrhoids. These occur in the veins that are outside of the anus and can be felt as a painful swelling or hard lump near the anus.  Most hemorrhoids do not cause serious problems, and they can be managed with home treatments such as diet and lifestyle changes. If home treatments do not help your symptoms, procedures can be done to shrink or remove the hemorrhoids. What are the causes? This condition is caused by increased pressure in the anal area. This pressure may result from various things, including: Constipation. Straining to have a bowel movement. Diarrhea. Pregnancy. Obesity. Sitting for long periods of time. Heavy lifting or other activity that causes you to strain. Anal sex.  What are the signs or symptoms? Symptoms of this condition include: Pain. Anal itching or irritation. Rectal bleeding. Leakage of stool (feces). Anal swelling. One or more lumps around the anus.  How is this diagnosed? This condition can often be diagnosed through a visual exam. Other exams or tests may also be done, such as: Examination of the rectal area with a gloved hand (digital rectal exam). Examination of the anal canal using a small tube (anoscope). A blood test, if you have lost a significant amount of blood. A test to look inside the colon (sigmoidoscopy or colonoscopy).  How is this treated? This condition can usually be treated at home. However, various procedures may be done if dietary changes, lifestyle  changes, and other home treatments do not help your symptoms. These procedures can help make the hemorrhoids smaller or remove them completely. Some of these procedures involve surgery, and others do not. Common procedures include: Rubber band ligation. Rubber bands are placed at the base of the hemorrhoids to cut off the blood supply to them. Sclerotherapy. Medicine is injected into the hemorrhoids to shrink them. Infrared coagulation. A type of light energy is used to get rid of the hemorrhoids. Hemorrhoidectomy surgery. The hemorrhoids are surgically removed, and the veins that supply them are tied off. Stapled hemorrhoidopexy surgery. A circular stapling device is used to remove the hemorrhoids and use staples to cut off the blood supply to them.  Follow these instructions at home: Eating and drinking Eat foods that have a lot of fiber in them, such as whole grains, beans, nuts, fruits, and vegetables. Ask your health care provider about taking products that have added fiber (fiber supplements). Drink enough fluid to keep your urine clear or pale yellow. Managing pain and swelling Take warm sitz baths for 20 minutes, 3-4 times a day to ease pain and discomfort. If directed, apply ice to the affected area. Using ice packs between sitz baths may be helpful. Put ice in a plastic bag. Place a towel between your skin and the bag. Leave the ice on for 20 minutes, 2-3 times a day. General instructions Take over-the-counter and prescription medicines only as told by your health care provider. Use medicated creams or suppositories as told. Exercise regularly. Go to the bathroom when you have the urge to have a bowel movement. Do not  wait. Avoid straining to have bowel movements. Keep the anal area dry and clean. Use wet toilet paper or moist towelettes after a bowel movement. Do not sit on the toilet for long periods of time. This increases blood pooling and pain. Contact a health care provider  if: You have increasing pain and swelling that are not controlled by treatment or medicine. You have uncontrolled bleeding. You have difficulty having a bowel movement, or you are unable to have a bowel movement. You have pain or inflammation outside the area of the hemorrhoids. This information is not intended to replace advice given to you by your health care provider. Make sure you discuss any questions you have with your health care provider. Document Released: 01/29/2000 Document Revised: 07/01/2015 Document Reviewed: 10/15/2014 Elsevier Interactive Patient Education  [employer].

## 2021-08-13 NOTE — Progress Notes (Signed)
Subjective:  Jackson Arnold is a 56 y.o. male who presents for Chief Complaint  Patient presents with   itching of rectum    Itching of rectum- raw- used preparation H, getting in the way of sleeping.      Here for concern with rectal itching.  He has had this before.  He says we have talked about this before.  He sometimes goes long periods between having issues like this but lately has been aggravated.  In the past she has used hydrocortisone 2.5% but he is out of this.  He had a colonoscopy 2019 and there was nothing unusual other than some internal hemorrhoids.  No blood in the stool.  He uses wet wipes and tissue to clean and does a good job of cleaning.  No other aggravating or relieving factors.    No other c/o.  The following portions of the patient's history were reviewed and updated as appropriate: allergies, current medications, past family history, past medical history, past social history, past surgical history and problem list.  ROS Otherwise as in subjective above    Objective: BP 120/78   Pulse 78   Temp (!) 97.5 F (36.4 C)   Wt 175 lb 12.8 oz (79.7 kg)   BMI 24.52 kg/m   General appearance: alert, no distress, well developed, well nourished Anus with 2 small areas of hemorrhoidal flareup otherwise normal-appearing, no aphasia no other obvious abnormality    Assessment: Encounter Diagnoses  Name Primary?   Rectal itching Yes   External hemorrhoid      Plan: We discussed symptoms and concerns.  We discussed sitz bath's versus hot soak in the bathtub with soap or Epsom salts when having symptoms.  Begin cream below for flareups as needed use.  Avoid chronic use for weeks and weeks.  We discussed hemorrhoid prevention, good water and fiber intake  I reviewed his 2019 colonoscopy report showing some internal hemorrhoids but otherwise anus and rectum normal   [name] was seen today for itching of rectum.  Diagnoses and all orders for this visit:  Rectal  itching  External hemorrhoid  Other orders -     hydrocortisone 2.5 % cream; Apply topically 2 (two) times daily.    Follow up: As needed

## 2021-10-20 ENCOUNTER — Encounter: Payer: Self-pay | Admitting: Internal Medicine

## 2021-11-07 ENCOUNTER — Other Ambulatory Visit: Payer: Self-pay | Admitting: Medical

## 2021-11-10 ENCOUNTER — Encounter: Payer: BC Managed Care – PPO | Admitting: Medical

## 2021-11-16 DIAGNOSIS — J019 Acute sinusitis, unspecified: Secondary | ICD-10-CM | POA: Diagnosis not present

## 2021-11-23 ENCOUNTER — Encounter: Payer: Self-pay | Admitting: Internal Medicine

## 2021-12-06 ENCOUNTER — Ambulatory Visit: Payer: BC Managed Care – PPO | Admitting: Medical

## 2021-12-06 NOTE — Progress Notes (Deleted)
Smoker Hx/o sarcoid Insom RLS Vit D  Due flu, shingrix, covid  Colon 19  PFT nl 9/22  Abnrmal CT chest 10/22, emphysma  Lymphocyts elevated  Cmet Cbc with diff Ldh Lipid Vit D UA Hgba1c? Psa

## 2022-02-10 ENCOUNTER — Other Ambulatory Visit: Payer: Self-pay | Admitting: Medical

## 2022-02-10 NOTE — Telephone Encounter (Signed)
Has upcoming appointment soon

## 2022-02-21 ENCOUNTER — Ambulatory Visit (INDEPENDENT_AMBULATORY_CARE_PROVIDER_SITE_OTHER): Payer: BC Managed Care – PPO | Admitting: Medical

## 2022-02-21 ENCOUNTER — Encounter: Payer: Self-pay | Admitting: Medical

## 2022-02-21 VITALS — BP 132/90 | HR 69 | Temp 97.5°F | Resp 14 | Ht 71.5 in | Wt 174.4 lb

## 2022-02-21 DIAGNOSIS — R030 Elevated blood-pressure reading, without diagnosis of hypertension: Secondary | ICD-10-CM

## 2022-02-21 DIAGNOSIS — Z122 Encounter for screening for malignant neoplasm of respiratory organs: Secondary | ICD-10-CM

## 2022-02-21 DIAGNOSIS — Z0000 Encounter for general adult medical examination without abnormal findings: Secondary | ICD-10-CM | POA: Diagnosis not present

## 2022-02-21 DIAGNOSIS — E785 Hyperlipidemia, unspecified: Secondary | ICD-10-CM

## 2022-02-21 DIAGNOSIS — E559 Vitamin D deficiency, unspecified: Secondary | ICD-10-CM | POA: Diagnosis not present

## 2022-02-21 DIAGNOSIS — Z7185 Encounter for immunization safety counseling: Secondary | ICD-10-CM | POA: Diagnosis not present

## 2022-02-21 DIAGNOSIS — Z862 Personal history of diseases of the blood and blood-forming organs and certain disorders involving the immune mechanism: Secondary | ICD-10-CM

## 2022-02-21 DIAGNOSIS — Z125 Encounter for screening for malignant neoplasm of prostate: Secondary | ICD-10-CM

## 2022-02-21 DIAGNOSIS — G4700 Insomnia, unspecified: Secondary | ICD-10-CM

## 2022-02-21 DIAGNOSIS — K635 Polyp of colon: Secondary | ICD-10-CM

## 2022-02-21 DIAGNOSIS — Z720 Tobacco use: Secondary | ICD-10-CM

## 2022-02-21 LAB — POCT URINALYSIS DIP (PROADVANTAGE DEVICE)
Bilirubin, UA: NEGATIVE
Blood, UA: NEGATIVE
Glucose, UA: NEGATIVE mg/dL
Ketones, POC UA: NEGATIVE mg/dL
Leukocytes, UA: NEGATIVE
Nitrite, UA: NEGATIVE
Protein Ur, POC: NEGATIVE mg/dL
Specific Gravity, Urine: 1.02
Urobilinogen, Ur: 0.2
pH, UA: 6 (ref 5.0–8.0)

## 2022-02-21 NOTE — Progress Notes (Signed)
Subjective:   HPI  Jackson Arnold is a 57 y.o. male who presents for Chief Complaint  Patient presents with   Annual Exam    Fasting. No additional concerns.    Patient Care Team: Calandra Madura, Leward Quan as PCP - General (Family Medicine) Sees dentist Sees eye doctor  Concerns: No recent problems  Still smokes about 1 ppd.  Has hx/o sarcoidosis.    Otherwise in normal state of health.   Reviewed their medical, surgical, family, social, medication, and allergy history and updated chart as appropriate.  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 Havery Moros   LUNG BIOPSY  1998   TONSILLECTOMY      Family History  Problem Relation Age of Onset   Diabetes Mother    Cancer Mother        skin, lymphoma   Lung cancer Father    Cancer Maternal Aunt        colon   Colon cancer Maternal Aunt    Stroke Paternal Grandmother    Hypertension Neg Hx    Esophageal cancer Neg Hx    Liver cancer Neg Hx    Pancreatic cancer Neg Hx    Prostate cancer Neg Hx    Rectal cancer Neg Hx    Stomach cancer Neg Hx      Current Outpatient Medications:    atorvastatin (LIPITOR) 20 MG tablet, TAKE 1 TABLET BY MOUTH DAILY, Disp: 30 tablet, Rfl: 0   gemfibrozil (LOPID) 600 MG tablet, TAKE 1 TABLET BY MOUTH TWICE A DAY, Disp: 180 tablet, Rfl: 0   hydrocortisone 2.5 % cream, Apply topically 2 (two) times daily., Disp: 30 g, Rfl: 2  Allergies  Allergen Reactions   Tramadol Nausea And Vomiting     Review of Systems Constitutional: -fever, -chills, -sweats, -unexpected weight change, -decreased appetite,  -fatigue Allergy: -sneezing, -itching, -congestion Dermatology: -changing moles, --rash, -lumps ENT: -runny nose, -ear pain, -sore throat, -hoarseness, -sinus pain, -teeth pain, - ringing in ears, -hearing loss, -nosebleeds Cardiology: -chest pain, -palpitations, -swelling, -difficulty breathing when lying flat, -waking up short of breath Respiratory: -cough, -shortness of breath, -difficulty breathing with exercise or exertion, -wheezing, -coughing up blood Gastroenterology: -abdominal pain, -nausea, -vomiting, -diarrhea, -constipation, -blood in stool, -changes in bowel movement, -difficulty swallowing or eating Hematology: -bleeding, -bruising  Musculoskeletal: -joint aches, -muscle aches, -joint swelling, -back pain, -neck pain, -cramping, -changes in gait Ophthalmology: denies vision changes, eye redness, itching, discharge Urology: -burning with urination, -difficulty urinating, -blood in urine, -urinary frequency, -urgency, -incontinence Neurology: -headache, -weakness, -tingling, -numbness, -memory loss, -falls, -dizziness Psychology: -depressed mood, -agitation, -sleep problems Male GU: no testicular mass, pain, no lymph nodes swollen, no swelling, no rash.     02/21/2022    9:18 AM 08/13/2021   10:16 AM 03/15/2021    8:16 AM 11/09/2020    8:16 AM 11/07/2019    8:11 AM  Depression screen PHQ 2/9  Decreased Interest 0 0 0 0 0  Down, Depressed, Hopeless 0 0 0 0 0  PHQ - 2  Score 0 0 0 0 0        Objective:  BP (!) 132/90   Pulse 69   Temp (!) 97.5 F (36.4 C) (Tympanic)   Resp 14   Ht 5' 11.5" (1.816 m)   Wt 174 lb 6.4 oz (79.1 kg)   SpO2 98% Comment: room air  BMI 23.98 kg/m   General appearance: alert, no distress, WD/WN, African American male Skin: unremarkable HEENT: normocephalic, conjunctiva/corneas normal, sclerae anicteric, PERRLA, EOMi, nares patent, no discharge or erythema, pharynx normal Oral cavity: MMM, tongue normal, teeth in good repair Neck: supple, no  lymphadenopathy, no thyromegaly, no masses, normal ROM, no bruits Chest: non tender, normal shape and expansion Heart: RRR, normal S1, S2, no murmurs Lungs: CTA bilaterally, no wheezes, rhonchi, or rales Abdomen: +bs, soft, non tender, non distended, no masses, no hepatomegaly, no splenomegaly, no bruits Back: non tender, normal ROM, no scoliosis Musculoskeletal: upper extremities non tender, no obvious deformity, normal ROM throughout, lower extremities non tender, no obvious deformity, normal ROM throughout Extremities: no edema, no cyanosis, no clubbing Pulses: 2+ symmetric, upper and lower extremities, normal cap refill Neurological: alert, oriented x 3, CN2-12 intact, strength normal upper extremities and lower extremities, sensation normal throughout, DTRs 2+ throughout, no cerebellar signs, gait normal Psychiatric: normal affect, behavior normal, pleasant  GU: normal male external genitalia,circumcised, nontender, no masses, no hernia, no lymphadenopathy Rectal: anus normal tone, prostate moderately enlarged but no nodules   Assessment and Plan :   Encounter Diagnoses  Name Primary?   Annual physical exam Yes   Hyperlipidemia, unspecified hyperlipidemia type    Vitamin D deficiency    Vaccine counseling    Tobacco use    Screening for prostate cancer    Screening for lung cancer    Insomnia, unspecified type    History of sarcoidosis    Polyp of colon, unspecified part of colon, unspecified type    Elevated blood pressure reading in office without diagnosis of hypertension     This visit was a preventative care visit, also known as wellness visit or routine physical.   Topics typically include healthy lifestyle, diet, exercise, preventative care, vaccinations, sick and well care, proper use of emergency dept and after hours care, as well as other concerns.     Recommendations: Continue to return yearly for your annual wellness and preventative care visits.  This gives [place] a  chance to discuss healthy lifestyle, exercise, vaccinations, review your chart record, and perform screenings where appropriate.  I recommend you see your eye doctor yearly for routine vision care.  I recommend you see your dentist yearly for routine dental care including hygiene visits twice yearly.   Vaccination recommendations were reviewed Immunization History  Administered Date(s) Administered   [employer] Vaccination 03/06/2019, 09/06/2019, 10/04/2019   Pneumococcal Polysaccharide-23 10/10/2012, 11/09/2020   Tdap 11/07/2019    I recommend yearly flu shot, Shingrix shingles vaccine.  You declined vaccines today   Screening for cancer: Colon cancer screening: I reviewed your colonoscopy on file that is up to date from 2019.  You are due repeat 2024 this year.  Referral placed  We discussed PSA, prostate exam, and prostate cancer screening risks/benefits.     Skin cancer screening: Check your skin regularly for new changes, growing lesions, or other lesions of concern Come in for evaluation if you have skin lesions of concern.  Lung cancer screening: Updated order for chest CT today  We currently don't have screenings for other cancers besides  breast, cervical, colon, and lung cancers.  If you have a strong family history of cancer or have other cancer screening concerns, please let me know.    Bone health: Get at least 150 minutes of aerobic exercise weekly Get weight bearing exercise at least once weekly Bone density test:  A bone density test is an imaging test that uses a type of X-ray to measure the amount of calcium and other minerals in your bones. The test may be used to diagnose or screen you for a condition that causes weak or thin bones (osteoporosis), predict your risk for a broken bone (fracture), or determine how well your osteoporosis treatment is working. The bone density test is recommended for females 40 and older, or females or males <10 if  certain risk factors such as thyroid disease, long term use of steroids such as for asthma or rheumatological issues, vitamin D deficiency, estrogen deficiency, family history of osteoporosis, self or family history of fragility fracture in first degree relative.    Heart health: Get at least 150 minutes of aerobic exercise weekly Limit alcohol It is important to maintain a healthy blood pressure and healthy cholesterol numbers  Heart disease screening: Screening for heart disease includes screening for blood pressure, fasting lipids, glucose/diabetes screening, BMI height to weight ratio, reviewed of smoking status, physical activity, and diet.    Goals include blood pressure 120/80 or less, maintaining a healthy lipid/cholesterol profile, preventing diabetes or keeping diabetes numbers under good control, not smoking or using tobacco products, exercising most days per week or at least 150 minutes per week of exercise, and eating healthy variety of fruits and vegetables, healthy oils, and avoiding unhealthy food choices like fried food, fast food, high sugar and high cholesterol foods.    Other tests may possibly include EKG test, CT coronary calcium score, echocardiogram, exercise treadmill stress test.    Medical care options: I recommend you continue to seek care here first for routine care.  We try really hard to have available appointments Monday through Friday daytime hours for sick visits, acute visits, and physicals.  Urgent care should be used for after hours and weekends for significant issues that cannot wait till the next day.  The emergency department should be used for significant potentially life-threatening emergencies.  The emergency department is expensive, can often have long wait times for less significant concerns, so try to utilize primary care, urgent care, or telemedicine when possible to avoid unnecessary trips to the emergency department.  Virtual visits and telemedicine  have been introduced since the pandemic started in 2020, and can be convenient ways to receive medical care.  We offer virtual appointments as well to assist you in a variety of options to seek medical care.    Separate significant issues discussed: History of sarcoidosis, continues to smoke  Tobacco use - advised cessation, counseled on quitting  Hyperlipidemia-continue current medication, updated labs today  Vitamin D deficiency-updated labs today, not currenty on supplement   Keontre was seen today for annual exam.  Diagnoses and all orders for this visit:  Annual physical exam -     POCT Urinalysis DIP (Proadvantage Device) -     Comprehensive metabolic panel -     CBC with Differential/Platelet -     Lipid panel -     PSA -     VITAMIN D 25 Hydroxy (Vit-D Deficiency, Fractures)  Hyperlipidemia, unspecified hyperlipidemia type -     Lipid panel  Vitamin D deficiency -  VITAMIN D 25 Hydroxy (Vit-D Deficiency, Fractures)  Vaccine counseling  Tobacco use  Screening for prostate cancer -     PSA  Screening for lung cancer -     CT CHEST LUNG CA SCREEN LOW DOSE W/O CM; Future  Insomnia, unspecified type  History of sarcoidosis  Polyp of colon, unspecified part of colon, unspecified type -     Ambulatory referral to Gastroenterology  Elevated blood pressure reading in office without diagnosis of hypertension    Follow-up pending labs, yearly for physical

## 2022-02-22 ENCOUNTER — Other Ambulatory Visit: Payer: Self-pay | Admitting: Medical

## 2022-02-22 DIAGNOSIS — D582 Other hemoglobinopathies: Secondary | ICD-10-CM

## 2022-02-22 LAB — CBC WITH DIFFERENTIAL/PLATELET
Basophils Absolute: 0.1 10*3/uL (ref 0.0–0.2)
Basos: 1 %
EOS (ABSOLUTE): 0.2 10*3/uL (ref 0.0–0.4)
Eos: 2 %
Hematocrit: 52.5 % — ABNORMAL HIGH (ref 37.5–51.0)
Hemoglobin: 18 g/dL — ABNORMAL HIGH (ref 13.0–17.7)
Immature Grans (Abs): 0 10*3/uL (ref 0.0–0.1)
Immature Granulocytes: 0 %
Lymphocytes Absolute: 3 10*3/uL (ref 0.7–3.1)
Lymphs: 32 %
MCH: 29.7 pg (ref 26.6–33.0)
MCHC: 34.3 g/dL (ref 31.5–35.7)
MCV: 87 fL (ref 79–97)
Monocytes Absolute: 1 10*3/uL — ABNORMAL HIGH (ref 0.1–0.9)
Monocytes: 10 %
Neutrophils Absolute: 5.1 10*3/uL (ref 1.4–7.0)
Neutrophils: 55 %
Platelets: 344 10*3/uL (ref 150–450)
RBC: 6.06 x10E6/uL — ABNORMAL HIGH (ref 4.14–5.80)
RDW: 12.3 % (ref 11.6–15.4)
WBC: 9.2 10*3/uL (ref 3.4–10.8)

## 2022-02-22 LAB — COMPREHENSIVE METABOLIC PANEL
ALT: 26 IU/L (ref 0–44)
AST: 16 IU/L (ref 0–40)
Albumin/Globulin Ratio: 1.6 (ref 1.2–2.2)
Albumin: 4.9 g/dL (ref 3.8–4.9)
Alkaline Phosphatase: 92 IU/L (ref 44–121)
BUN/Creatinine Ratio: 11 (ref 9–20)
BUN: 11 mg/dL (ref 6–24)
Bilirubin Total: 0.5 mg/dL (ref 0.0–1.2)
CO2: 23 mmol/L (ref 20–29)
Calcium: 10.6 mg/dL — ABNORMAL HIGH (ref 8.7–10.2)
Chloride: 101 mmol/L (ref 96–106)
Creatinine, Ser: 1 mg/dL (ref 0.76–1.27)
Globulin, Total: 3.1 g/dL (ref 1.5–4.5)
Glucose: 95 mg/dL (ref 70–99)
Potassium: 4.5 mmol/L (ref 3.5–5.2)
Sodium: 141 mmol/L (ref 134–144)
Total Protein: 8 g/dL (ref 6.0–8.5)
eGFR: 89 mL/min/{1.73_m2} (ref >59)

## 2022-02-22 LAB — VITAMIN D 25 HYDROXY (VIT D DEFICIENCY, FRACTURES): Vit D, 25-Hydroxy: 21.6 ng/mL — ABNORMAL LOW (ref 30.0–100.0)

## 2022-02-22 LAB — PSA: Prostate Specific Ag, Serum: 0.8 ng/mL (ref 0.0–4.0)

## 2022-02-22 LAB — LIPID PANEL
Chol/HDL Ratio: 5 ratio (ref 0.0–5.0)
Cholesterol, Total: 185 mg/dL (ref 100–199)
HDL: 37 mg/dL — ABNORMAL LOW (ref >39)
LDL Chol Calc (NIH): 128 mg/dL — ABNORMAL HIGH (ref 0–99)
Triglycerides: 107 mg/dL (ref 0–149)
VLDL Cholesterol Cal: 20 mg/dL (ref 5–40)

## 2022-02-22 MED ORDER — GEMFIBROZIL 600 MG PO TABS: 600.0000 mg | ORAL_TABLET | Freq: Two times a day (BID) | ORAL | 3 refills | Status: DC

## 2022-02-22 MED ORDER — ATORVASTATIN CALCIUM 20 MG PO TABS: ORAL_TABLET | ORAL | 3 refills | Status: DC

## 2022-02-22 MED ORDER — VITAMIN D 50 MCG (2000 UT) PO CAPS: 1.0000 | ORAL_CAPSULE | Freq: Every day | ORAL | 3 refills | Status: DC

## 2022-02-22 NOTE — Progress Notes (Signed)
Results sent through MyChart

## 2022-03-21 ENCOUNTER — Ambulatory Visit
Admission: RE | Admit: 2022-03-21 | Discharge: 2022-03-21 | Disposition: A | Discharge status: Home / Self Care | Payer: BC Managed Care – PPO | Source: Ambulatory Visit | Attending: Medical | Admitting: Medical

## 2022-03-21 DIAGNOSIS — F17210 Nicotine dependence, cigarettes, uncomplicated: Secondary | ICD-10-CM | POA: Diagnosis not present

## 2022-03-21 DIAGNOSIS — Z122 Encounter for screening for malignant neoplasm of respiratory organs: Secondary | ICD-10-CM

## 2022-03-22 ENCOUNTER — Other Ambulatory Visit: Payer: BC Managed Care – PPO

## 2022-03-22 NOTE — Progress Notes (Signed)
Results sent through MyChart

## 2022-05-11 ENCOUNTER — Encounter: Payer: Self-pay | Admitting: Gastroenterology

## 2022-05-26 ENCOUNTER — Encounter: Payer: Self-pay | Admitting: Gastroenterology

## 2022-05-26 ENCOUNTER — Ambulatory Visit (AMBULATORY_SURGERY_CENTER): Payer: BC Managed Care – PPO

## 2022-05-26 VITALS — Ht 72.0 in | Wt 175.0 lb

## 2022-05-26 DIAGNOSIS — D860 Sarcoidosis of lung: Secondary | ICD-10-CM | POA: Insufficient documentation

## 2022-05-26 DIAGNOSIS — Z86010 Personal history of colonic polyps: Secondary | ICD-10-CM

## 2022-05-26 DIAGNOSIS — J45909 Unspecified asthma, uncomplicated: Secondary | ICD-10-CM | POA: Insufficient documentation

## 2022-05-26 MED ORDER — NA SULFATE-K SULFATE-MG SULF 17.5-3.13-1.6 GM/177ML PO SOLN: 1.0000 | Freq: Once | ORAL | 0 refills | Status: AC

## 2022-05-26 NOTE — Progress Notes (Signed)

## 2022-06-15 HISTORY — PX: COLONOSCOPY: SHX174

## 2022-06-21 ENCOUNTER — Encounter: Payer: Self-pay | Admitting: Gastroenterology

## 2022-06-21 ENCOUNTER — Ambulatory Visit (AMBULATORY_SURGERY_CENTER): Payer: BC Managed Care – PPO | Admitting: Gastroenterology

## 2022-06-21 VITALS — BP 100/70 | HR 65 | Temp 97.5°F | Resp 11 | Ht 72.0 in | Wt 175.0 lb

## 2022-06-21 DIAGNOSIS — Z1211 Encounter for screening for malignant neoplasm of colon: Secondary | ICD-10-CM | POA: Diagnosis not present

## 2022-06-21 DIAGNOSIS — D123 Benign neoplasm of transverse colon: Secondary | ICD-10-CM | POA: Diagnosis not present

## 2022-06-21 DIAGNOSIS — Z09 Encounter for follow-up examination after completed treatment for conditions other than malignant neoplasm: Secondary | ICD-10-CM

## 2022-06-21 DIAGNOSIS — D125 Benign neoplasm of sigmoid colon: Secondary | ICD-10-CM

## 2022-06-21 DIAGNOSIS — Z86010 Personal history of colonic polyps: Secondary | ICD-10-CM

## 2022-06-21 MED ORDER — SODIUM CHLORIDE 0.9 % IV SOLN: 500.0000 mL | Freq: Once | INTRAVENOUS | Status: DC

## 2022-06-21 NOTE — Patient Instructions (Signed)
Handouts Provided:  Polyps  YOU HAD AN ENDOSCOPIC PROCEDURE TODAY AT THE [facility]:   Refer to the procedure report that was given to you for any specific questions about what was found during the examination.  If the procedure report does not answer your questions, please call your gastroenterologist to clarify.  If you requested that your care partner not be given the details of your procedure findings, then the procedure report has been included in a sealed envelope for you to review at your convenience later.  YOU SHOULD EXPECT: Some feelings of bloating in the abdomen. Passage of more gas than usual.  Walking can help get rid of the air that was put into your GI tract during the procedure and reduce the bloating. If you had a lower endoscopy (such as a colonoscopy or flexible sigmoidoscopy) you may notice spotting of blood in your stool or on the toilet paper. If you underwent a bowel prep for your procedure, you may not have a normal bowel movement for a few days.  Please Note:  You might notice some irritation and congestion in your nose or some drainage.  This is from the oxygen used during your procedure.  There is no need for concern and it should clear up in a day or so.  SYMPTOMS TO REPORT IMMEDIATELY:  Following lower endoscopy (colonoscopy or flexible sigmoidoscopy):  Excessive amounts of blood in the stool  Significant tenderness or worsening of abdominal pains  Swelling of the abdomen that is new, acute  Fever of 100F or higher  For urgent or emergent issues, a gastroenterologist can be reached at any hour by calling [phone]. Do not use MyChart messaging for urgent concerns.    DIET:  We do recommend a small meal at first, but then you may proceed to your regular diet.  Drink plenty of fluids but you should avoid alcoholic beverages for 24 hours.  ACTIVITY:  You should plan to take it easy for the rest of today and you should NOT DRIVE or use heavy  machinery until tomorrow (because of the sedation medicines used during the test).    FOLLOW UP: Our staff will call the number listed on your records the next business day following your procedure.  We will call around 7:15- 8:00 am to check on you and address any questions or concerns that you may have regarding the information given to you following your procedure. If we do not reach you, we will leave a message.     If any biopsies were taken you will be contacted by phone or by letter within the next 1-3 weeks.  Please call us at [phone] if you have not heard about the biopsies in 3 weeks.    SIGNATURES/CONFIDENTIALITY: You and/or your care partner have signed paperwork which will be entered into your electronic medical record.  These signatures attest to the fact that that the information above on your After Visit Summary has been reviewed and is understood.  Full responsibility of the confidentiality of this discharge information lies with you and/or your care-partner.  

## 2022-06-21 NOTE — Op Note (Signed)
[facility] Patient Name: Jackson Arnold Procedure Date: 06/21/2022 11:12 AM MRN: [id number] Endoscopist: Viviann Spare P. [name] , MD, [id number] Age: 57 Referring MD:  Date of Birth: 08/25/1965 Gender: Male Account #: [id number] Procedure:                Colonoscopy Indications:              High risk colon cancer surveillance: Personal                            history of colonic polyps - last exam 05/2017 - 2                            polyps - sessile serrated and adenomatous Medicines:                Monitored Anesthesia Care Procedure:                Pre-Anesthesia Assessment:                           - Prior to the procedure, a History and Physical                            was performed, and patient medications and                            allergies were reviewed. The patient's tolerance of                            previous anesthesia was also reviewed. The risks                            and benefits of the procedure and the sedation                            options and risks were discussed with the patient.                            All questions were answered, and informed consent                            was obtained. Prior Anticoagulants: The patient has                            taken no anticoagulant or antiplatelet agents. ASA                            Grade Assessment: II - A patient with mild systemic                            disease. After reviewing the risks and benefits,                            the patient was deemed in satisfactory condition to  undergo the procedure.                           After obtaining informed consent, the colonoscope                            was passed under direct vision. Throughout the                            procedure, the patient's blood pressure, pulse, and                            oxygen saturations were monitored continuously. The                            Olympus  CF-HQ190L ([id number]) Colonoscope was                            introduced through the anus and advanced to the the                            cecum, identified by appendiceal orifice and                            ileocecal valve. The colonoscopy was performed                            without difficulty. The patient tolerated the                            procedure well. The quality of the bowel                            preparation was good. The ileocecal valve,                            appendiceal orifice, and rectum were photographed. Scope In: 11:22:50 AM Scope Out: 11:39:41 AM Scope Withdrawal Time: 0 hours 14 minutes 2 seconds  Total Procedure Duration: 0 hours 16 minutes 51 seconds  Findings:                 The perianal and digital rectal examinations were                            normal.                           A 4 mm polyp was found in the transverse colon. The                            polyp was flat. The polyp was removed with a cold                            snare. Resection and retrieval were complete.  A 3 mm polyp was found in the sigmoid colon. The                            polyp was sessile. The polyp was removed with a                            cold snare. Resection and retrieval were complete.                           Internal hemorrhoids were found during                            retroflexion. The hemorrhoids were small.                           The exam was otherwise without abnormality. Complications:            No immediate complications. Estimated blood loss:                            Minimal. Estimated Blood Loss:     Estimated blood loss was minimal. Impression:               - One 4 mm polyp in the transverse colon, removed                            with a cold snare. Resected and retrieved.                           - One 3 mm polyp in the sigmoid colon, removed with                            a cold snare.  Resected and retrieved.                           - Internal hemorrhoids.                           - The examination was otherwise normal.                           - The GI Genius (intelligent endoscopy module),                            computer-aided polyp detection system powered by AI                            was utilized to detect colorectal polyps through                            enhanced visualization during colonoscopy. Recommendation:           - Patient has a contact number available for  emergencies. The signs and symptoms of potential                            delayed complications were discussed with the                            patient. Return to normal activities tomorrow.                            Written discharge instructions were provided to the                            patient.                           - Resume previous diet.                           - Continue present medications.                           - Await pathology results. Viviann Spare P. Porshe Fleagle, MD 06/21/2022 11:44:48 AM This report has been signed electronically.

## 2022-06-21 NOTE — Progress Notes (Signed)
[facility] History and Physical   Primary Care Physician:  Jac Canavan, PA-C   Reason for Procedure:   History of colon polyps  Plan:    colonoscopy     HPI: Jackson Arnold is a 57 y.o. male  here for colonoscopy surveillance - last exam 05/2017 - SSP and TA removed.   Patient denies any bowel symptoms at this time. No family history of colon cancer known. Otherwise feels well without any cardiopulmonary symptoms.   I have discussed risks / benefits of anesthesia and endoscopic procedure with Jackson Arnold and they wish to proceed with the exams as outlined today.    Past Medical History:  Diagnosis Date   Bronchitis    Chest pain 06/2011   ED/observation visit   H/O CT scan 06/2011   cardiac, normal   History of EKG 5/13   sinus bradycardia   Hyperlipidemia    tricor prior   Pulmonary sarcoidosis (HCC) 1998   Tobacco use    Vitamin D deficiency     Past Surgical History:  Procedure Laterality Date   COLONOSCOPY  05/2017   tubular adenoma, polyps, repeat 2024; Dr. [name]   LUNG BIOPSY  1998   TONSILLECTOMY      Prior to Admission medications   Medication Sig Start Date End Date Taking? Authorizing Provider  atorvastatin (LIPITOR) 20 MG tablet TAKE 1 TABLET BY MOUTH DAILY 02/22/22  Yes Tysinger, Kermit Balo, PA-C  Cholecalciferol (VITAMIN D) 50 MCG (2000 UT) CAPS Take 1 capsule (2,000 Units total) by mouth daily. 02/22/22  Yes Tysinger, Kermit Balo, PA-C  gemfibrozil (LOPID) 600 MG tablet Take 1 tablet (600 mg total) by mouth 2 (two) times daily. 02/22/22  Yes Tysinger, Kermit Balo, PA-C  hydrocortisone 2.5 % cream Apply topically 2 (two) times daily. 08/13/21  Yes Tysinger, Kermit Balo, PA-C    Current Outpatient Medications  Medication Sig Dispense Refill   atorvastatin (LIPITOR) 20 MG tablet TAKE 1 TABLET BY MOUTH DAILY 90 tablet 3   Cholecalciferol (VITAMIN D) 50 MCG (2000 UT) CAPS Take 1 capsule (2,000 Units total) by mouth daily. 90 capsule 3    gemfibrozil (LOPID) 600 MG tablet Take 1 tablet (600 mg total) by mouth 2 (two) times daily. 180 tablet 3   hydrocortisone 2.5 % cream Apply topically 2 (two) times daily. 30 g 2   Current Facility-Administered Medications  Medication Dose Route Frequency Provider Last Rate Last Admin   0.9 %  sodium chloride infusion  500 mL Intravenous Once [name], Willaim Rayas, MD        Allergies as of 06/21/2022 - Review Complete 06/21/2022  Allergen Reaction Noted   Tramadol Nausea And Vomiting 07/03/2011    Family History  Problem Relation Arnold of Onset   Diabetes Mother    Cancer Mother        skin, lymphoma   Lung cancer Father    Cancer Maternal Aunt        colon   Colon cancer Maternal Aunt    Stroke Paternal Grandmother    Hypertension Neg Hx    Esophageal cancer Neg Hx    Liver cancer Neg Hx    Pancreatic cancer Neg Hx    Prostate cancer Neg Hx    Rectal cancer Neg Hx    Stomach cancer Neg Hx     Social History   Socioeconomic History   Marital status: Divorced    Spouse name: Not on file   Number of children: Not  on file   Years of education: Not on file   Highest education level: Not on file  Occupational History   Not on file  Tobacco Use   Smoking status: Some Days    Packs/day: 1.00    Years: 31.00    Additional pack years: 0.00    Total pack years: 31.00    Types: Cigarettes    Start date: 08/19/2017   Smokeless tobacco: Never  Vaping Use   Vaping Use: Never used  Substance and Sexual Activity   Alcohol use: No   Drug use: No   Sexual activity: Not on file  Other Topics Concern   Not on file  Social History Narrative   Married, 4 children, 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   02/2022   Social Determinants of Health   Financial Resource Strain: Not on file  Food Insecurity: Not on file  Transportation Needs: Not on file  Physical Activity: Not on file  Stress: Not on file  Social Connections: Not on file   Intimate Partner Violence: Not on file    Review of Systems: All other review of systems negative except as mentioned in the HPI.  Physical Exam: Vital signs BP 121/79   Pulse 80   Temp (!) 97.5 F (36.4 C)   Ht 6' (1.829 m)   Wt 175 lb (79.4 kg)   SpO2 99%   BMI 23.73 kg/m   General:   Alert,  Well-developed, pleasant and cooperative in NAD Lungs:  Clear throughout to auscultation.   Heart:  Regular rate and rhythm Abdomen:  Soft, nontender and nondistended.   Neuro/Psych:  Alert and cooperative. Normal mood and affect. A and O x 3  [name], MD [facility]

## 2022-06-21 NOTE — Progress Notes (Signed)
Pt's states no medical or surgical changes since previsit or office visit. 

## 2022-06-21 NOTE — Progress Notes (Signed)
To pacu, VSS. Report to [email or web address] 

## 2022-06-21 NOTE — Progress Notes (Signed)
Called to room to assist during endoscopic procedure.  Patient ID and intended procedure confirmed with present staff. Received instructions for my participation in the procedure from the performing physician.  

## 2022-06-22 ENCOUNTER — Telehealth: Payer: Self-pay

## 2022-06-22 NOTE — Telephone Encounter (Signed)
  Follow up Call-     06/21/2022   10:55 AM  Call back number  Post procedure Call Back phone  # [phone]  Permission to leave phone message Yes     Patient questions:  Do you have a fever, pain , or abdominal swelling? No. Pain Score  0 *  Have you tolerated food without any problems? Yes.    Have you been able to return to your normal activities? Yes.    Do you have any questions about your discharge instructions: Diet   No. Medications  No. Follow up visit  No.  Do you have questions or concerns about your Care? No.  Actions: * If pain score is 4 or above: No action needed, pain <4.

## 2022-10-07 DIAGNOSIS — M25531 Pain in right wrist: Secondary | ICD-10-CM | POA: Diagnosis not present

## 2022-10-07 DIAGNOSIS — M25511 Pain in right shoulder: Secondary | ICD-10-CM | POA: Diagnosis not present

## 2022-10-07 DIAGNOSIS — M19041 Primary osteoarthritis, right hand: Secondary | ICD-10-CM | POA: Diagnosis not present

## 2022-10-07 DIAGNOSIS — M79644 Pain in right finger(s): Secondary | ICD-10-CM | POA: Diagnosis not present

## 2023-02-23 ENCOUNTER — Encounter: Payer: BC Managed Care – PPO | Admitting: Medical

## 2023-03-21 IMAGING — CT CT CHEST LUNG CANCER SCREENING LOW DOSE W/O CM
1 of 2 series · 15 of 32 positions shown, 19 images · non-contrast
Comparison: None.

CLINICAL DATA: 55-year-old male with 30 pack-year history of
smoking. Lung cancer screening.

EXAM:
CT CHEST WITHOUT CONTRAST LOW-DOSE FOR LUNG CANCER SCREENING
TECHNIQUE: Multidetector CT imaging of the chest was performed following the
standard protocol without IV contrast.

[Series 3: ldct screen lung · axial · 0.77mm/px · z∈[-378,-54]mm · 15 of 359 slices shown, 19 images]
[im 17/359  mediastinal]
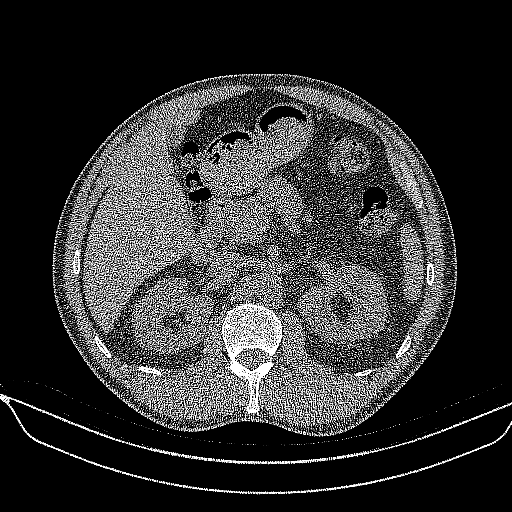
[im 17/359  lung]
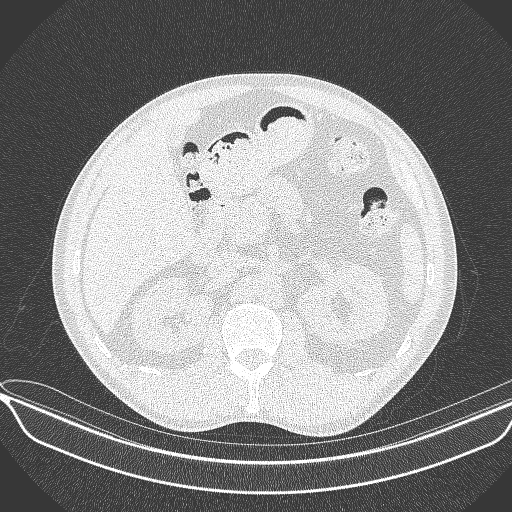
[im 49/359  lung]
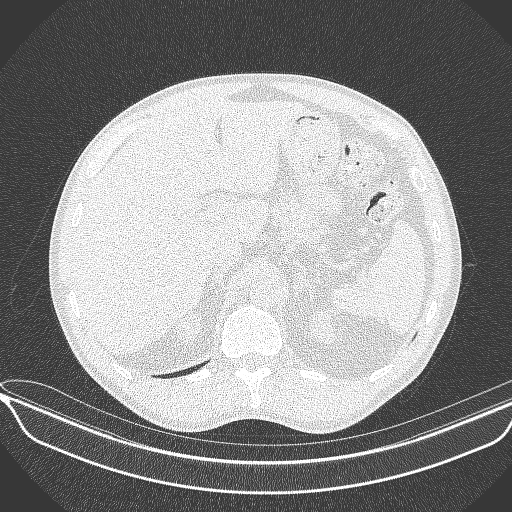
[im 82/359  lung]
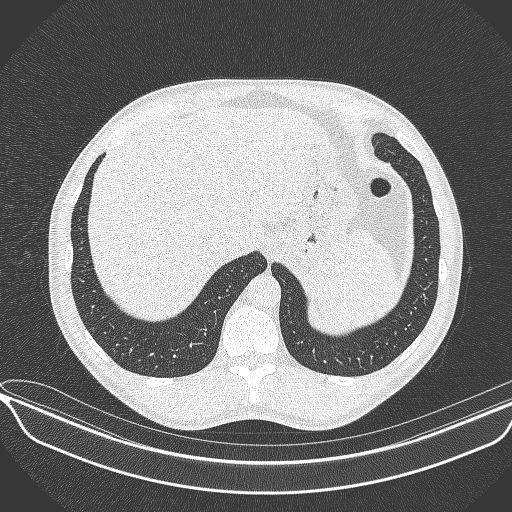
[im 90/359  lung]
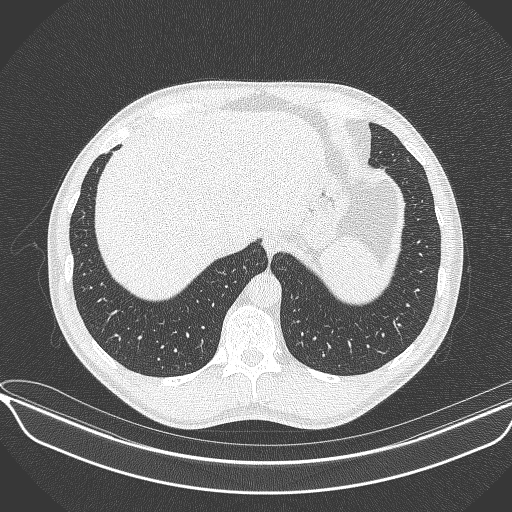
[im 114/359  mediastinal]
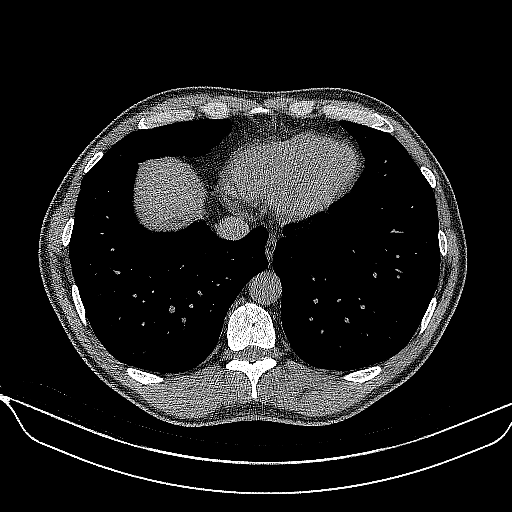
[im 114/359  lung]
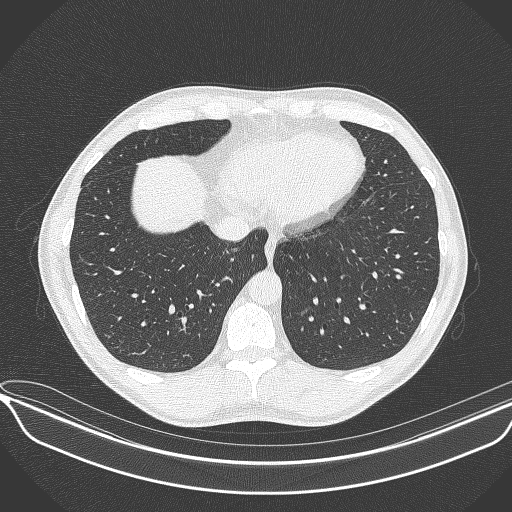
[im 131/359  lung]
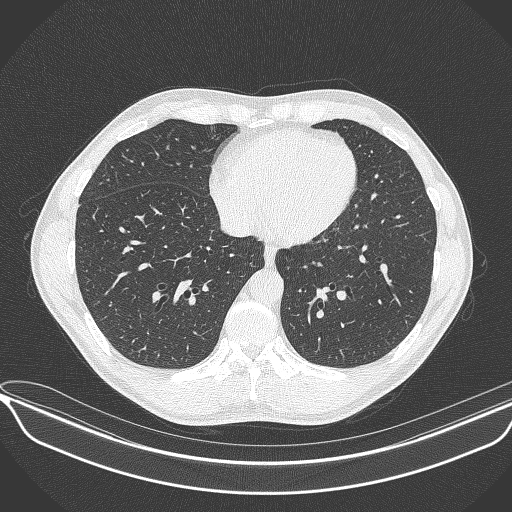
[im 163/359  lung]
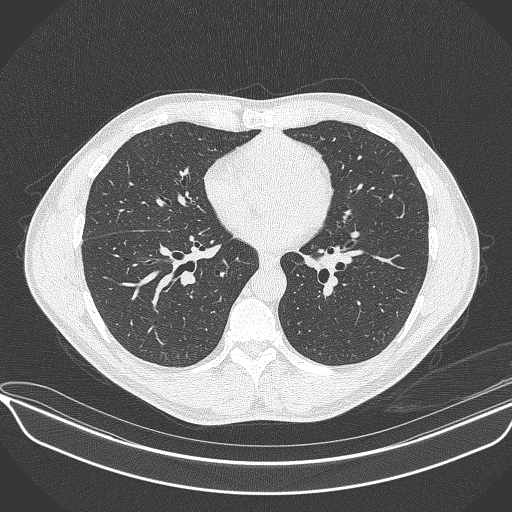
[im 180/359  lung]
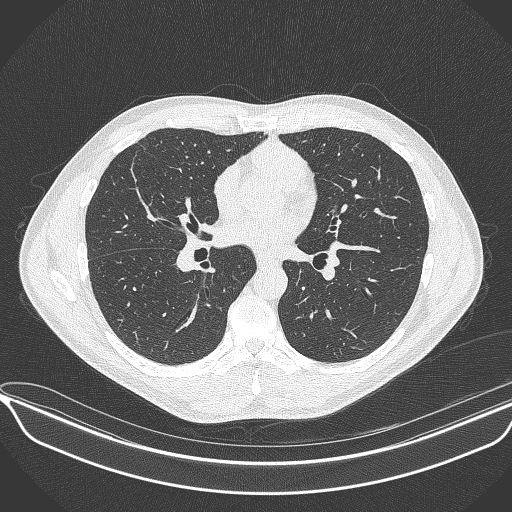
[im 196/359  mediastinal]
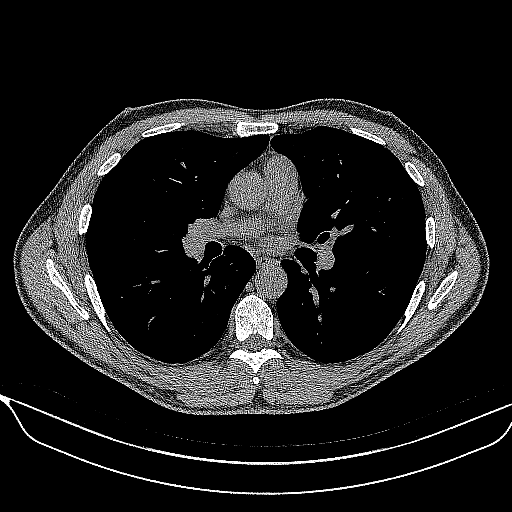
[im 196/359  lung]
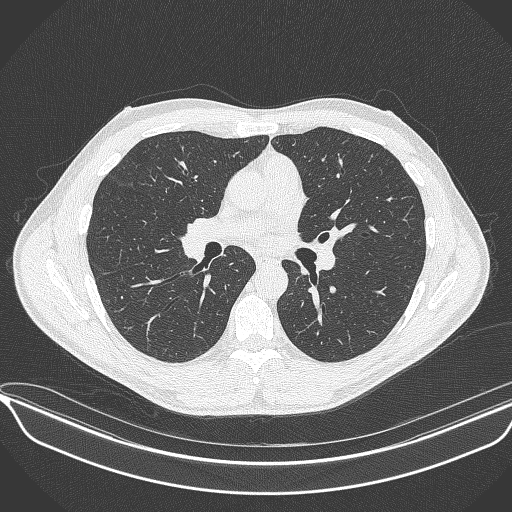
[im 228/359  lung]
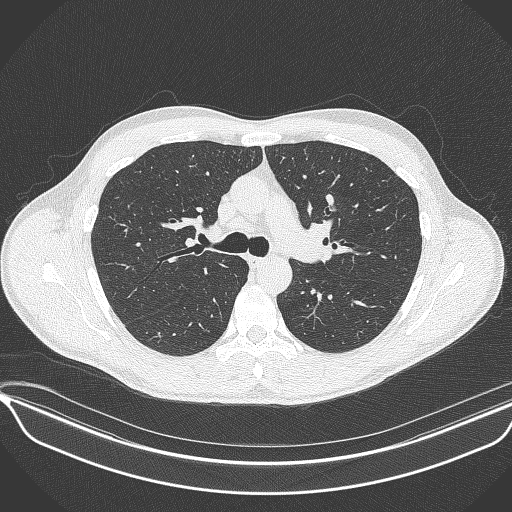
[im 245/359  lung]
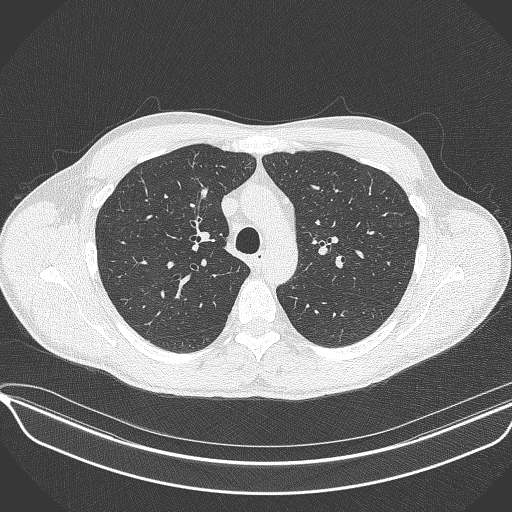
[im 269/359  lung]
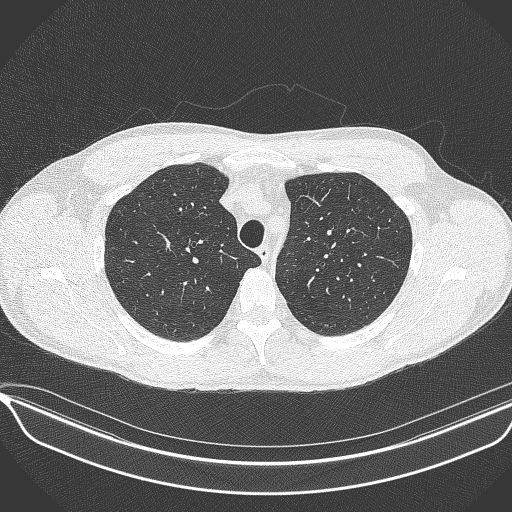
[im 293/359  mediastinal]
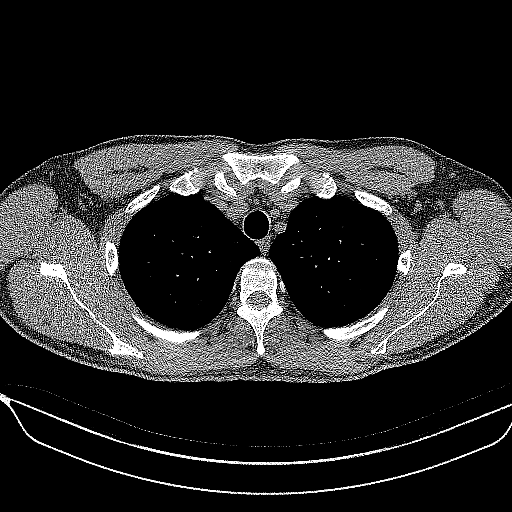
[im 293/359  lung]
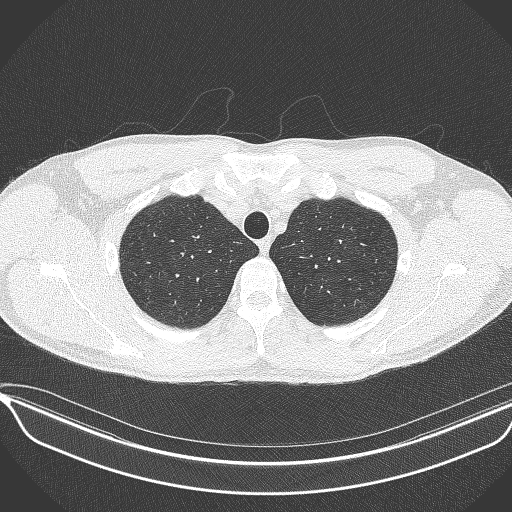
[im 310/359  lung]
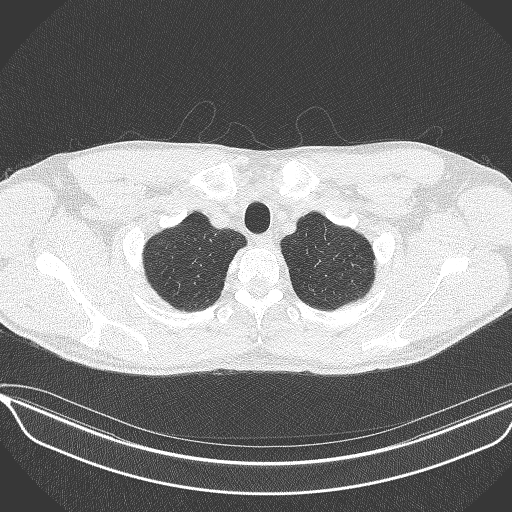
[im 342/359  lung]
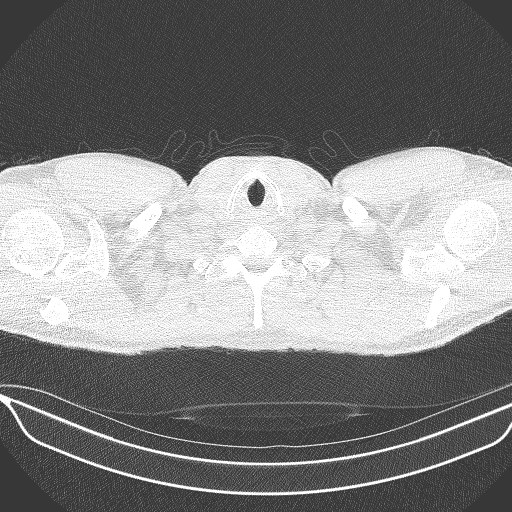

[15 of 32 positions shown; findings below may reference images not displayed]

FINDINGS: Cardiovascular: The heart size is normal. No substantial pericardial
effusion.

Mediastinum/Nodes: No mediastinal lymphadenopathy. No evidence for
gross hilar lymphadenopathy although assessment is limited by the
lack of intravenous contrast on the current study. The esophagus has
normal imaging features. There is no axillary lymphadenopathy.

Lungs/Pleura: Centrilobular emphsyema noted. No suspicious pulmonary
nodule or mass. No focal airspace consolidation. No pleural
effusion.

Upper Abdomen: Unremarkable.

Musculoskeletal: No worrisome lytic or sclerotic osseous
abnormality.
IMPRESSION: 1. Lung-RADS 1, negative. Continue annual screening with low-dose
chest CT without contrast in 12 months.
2.  Emphysema. ([id number])

## 2023-04-04 ENCOUNTER — Other Ambulatory Visit: Payer: Self-pay | Admitting: Medical

## 2023-05-02 ENCOUNTER — Encounter: Payer: Self-pay | Admitting: Medical

## 2023-05-02 ENCOUNTER — Ambulatory Visit: Payer: BC Managed Care – PPO | Admitting: Medical

## 2023-05-02 VITALS — BP 138/88 | HR 73 | Temp 97.7°F | Ht 71.0 in | Wt 173.8 lb

## 2023-05-02 DIAGNOSIS — E559 Vitamin D deficiency, unspecified: Secondary | ICD-10-CM | POA: Diagnosis not present

## 2023-05-02 DIAGNOSIS — Z862 Personal history of diseases of the blood and blood-forming organs and certain disorders involving the immune mechanism: Secondary | ICD-10-CM

## 2023-05-02 DIAGNOSIS — Z7185 Encounter for immunization safety counseling: Secondary | ICD-10-CM | POA: Diagnosis not present

## 2023-05-02 DIAGNOSIS — Z1389 Encounter for screening for other disorder: Secondary | ICD-10-CM

## 2023-05-02 DIAGNOSIS — Z122 Encounter for screening for malignant neoplasm of respiratory organs: Secondary | ICD-10-CM

## 2023-05-02 DIAGNOSIS — J45909 Unspecified asthma, uncomplicated: Secondary | ICD-10-CM | POA: Diagnosis not present

## 2023-05-02 DIAGNOSIS — Z720 Tobacco use: Secondary | ICD-10-CM

## 2023-05-02 DIAGNOSIS — Z0000 Encounter for general adult medical examination without abnormal findings: Secondary | ICD-10-CM

## 2023-05-02 DIAGNOSIS — E785 Hyperlipidemia, unspecified: Secondary | ICD-10-CM | POA: Diagnosis not present

## 2023-05-02 DIAGNOSIS — Z125 Encounter for screening for malignant neoplasm of prostate: Secondary | ICD-10-CM

## 2023-05-02 DIAGNOSIS — L989 Disorder of the skin and subcutaneous tissue, unspecified: Secondary | ICD-10-CM

## 2023-05-02 LAB — POCT URINALYSIS DIP (PROADVANTAGE DEVICE)
Blood, UA: NEGATIVE
Glucose, UA: NEGATIVE mg/dL
Leukocytes, UA: NEGATIVE
Nitrite, UA: NEGATIVE
Protein Ur, POC: NEGATIVE mg/dL
Specific Gravity, Urine: 1.025
Urobilinogen, Ur: NEGATIVE
pH, UA: 6 (ref 5.0–8.0)

## 2023-05-02 LAB — LIPID PANEL

## 2023-05-02 MED ORDER — HYDROCODONE BIT-HOMATROP MBR 5-1.5 MG/5ML PO SOLN: 5.0000 mL | Freq: Three times a day (TID) | ORAL | 0 refills | Status: AC | PRN

## 2023-05-02 MED ORDER — AMOXICILLIN 875 MG PO TABS: 875.0000 mg | ORAL_TABLET | Freq: Two times a day (BID) | ORAL | 0 refills | Status: AC

## 2023-05-02 NOTE — Progress Notes (Signed)
 Subjective:   HPI  Jackson Arnold is a 58 y.o. male who presents for Chief Complaint  Patient presents with   Annual Exam    Fasting cpe, has some sinus issues going- cough, drainage for 1.5 weeks. Declines flu,covid and shingles shot    Patient Care Team: Tana Trefry, Cleda Mccreedy as PCP - General (Family Medicine) Sees dentist Sees eye doctor Dr. Ileene Patrick, GI Dr. Francena Hanly, ortho    Concerns: Still smokes about 1 ppd.  Has hx/o sarcoidosis.    Been dealing with recent illness, cough, sinus pressure.  Symptoms x 1 week, mostly in sinuses, using allegra, and some mucinex DM.    Compliant with Lipitor and lopid without c/o  Using vit D supplement regularly   Saw ortho recent, had steroid injection of shoulder for bursitis  Reviewed their medical, surgical, family, social, medication, and allergy history and updated chart as appropriate.  Past Medical History:  Diagnosis Date   Bronchitis    Chest pain 06/2011   ED/observation visit   H/O CT scan 06/2011   cardiac, normal   History of EKG 5/13   sinus bradycardia   Hyperlipidemia    tricor prior   Pulmonary sarcoidosis (HCC) 1998   Tobacco use    Vitamin D deficiency     Past Surgical History:  Procedure Laterality Date   COLONOSCOPY  05/2017   tubular adenoma, polyps, repeat 2024; Dr. [name]   COLONOSCOPY  06/2022   Dr. [name], repeat 7 years   LUNG BIOPSY  1998   TONSILLECTOMY      Family History  Problem Relation Age of Onset   Diabetes Mother    Cancer Mother        skin, lymphoma   Lung cancer Father    Cancer Maternal Aunt        colon   Colon cancer Maternal Aunt    Stroke Paternal Grandmother    Hypertension Neg Hx    Esophageal cancer Neg Hx    Liver cancer Neg Hx    Pancreatic cancer Neg Hx    Prostate cancer Neg Hx    Rectal cancer Neg Hx    Stomach cancer Neg Hx      Current Outpatient Medications:    amoxicillin (AMOXIL) 875 MG tablet, Take 1 tablet (875 mg  total) by mouth 2 (two) times daily for 10 days., Disp: 20 tablet, Rfl: 0   atorvastatin (LIPITOR) 20 MG tablet, TAKE 1 TABLET BY MOUTH EVERY DAY, Disp: 90 tablet, Rfl: 0   Cholecalciferol (VITAMIN D) 50 MCG (2000 UT) CAPS, Take 1 capsule (2,000 Units total) by mouth daily., Disp: 90 capsule, Rfl: 3   gemfibrozil (LOPID) 600 MG tablet, TAKE 1 TABLET BY MOUTH TWICE A DAY, Disp: 180 tablet, Rfl: 0   HYDROcodone bit-homatropine (HYCODAN) 5-1.5 MG/5ML syrup, Take 5 mLs by mouth every 8 (eight) hours as needed for up to 5 days for cough., Disp: 75 mL, Rfl: 0   hydrocortisone 2.5 % cream, Apply topically 2 (two) times daily., Disp: 30 g, Rfl: 2  Allergies  Allergen Reactions   Tramadol Nausea And Vomiting    Review of Systems  Constitutional:  Negative for chills, fever, malaise/fatigue and weight loss.  HENT:  Positive for congestion and sinus pain. Negative for ear pain, hearing loss, sore throat and tinnitus.   Eyes:  Negative for blurred vision, pain and redness.  Respiratory:  Positive for cough and sputum production. Negative for hemoptysis and shortness of breath.  Cardiovascular:  Negative for chest pain, palpitations, orthopnea, claudication and leg swelling.  Gastrointestinal:  Negative for abdominal pain, blood in stool, constipation, diarrhea, nausea and vomiting.  Genitourinary:  Negative for dysuria, flank pain, frequency, hematuria and urgency.  Musculoskeletal:  Negative for falls, joint pain and myalgias.  Skin:  Negative for itching and rash.  Neurological:  Negative for dizziness, tingling, speech change, weakness and headaches.  Endo/Heme/Allergies:  Negative for polydipsia. Does not bruise/bleed easily.  Psychiatric/Behavioral:  Negative for depression and memory loss. The patient is not nervous/anxious and does not have insomnia.         05/02/2023    8:20 AM 02/21/2022    9:18 AM 08/13/2021   10:16 AM 03/15/2021    8:16 AM 11/09/2020    8:16 AM  Depression screen PHQ  2/9  Decreased Interest 0 0 0 0 0  Down, Depressed, Hopeless 0 0 0 0 0  PHQ - 2 Score 0 0 0 0 0        Objective:  BP 138/88   Pulse 73   Temp 97.7 F (36.5 C)   Ht 5\' 11"  (1.803 m)   Wt 173 lb 12.8 oz (78.8 kg)   SpO2 97%   BMI 24.24 kg/m   General appearance: alert, no distress, WD/WN, African American male Skin: left lower jaw area with 3mm raised pink lesion, low back midline with 2 adjacent lesions, 1 is a 3mm flat cherry angioma, the inferior one is 3mm brownish somewhat asymmetrical, other scattered benign lesions HEENT: normocephalic, conjunctiva/corneas normal, sclerae anicteric, PERRLA, EOMi, nares patent, no discharge or erythema, pharynx normal Oral cavity: MMM, tongue normal, teeth in good repair Neck: supple, no lymphadenopathy, no thyromegaly, no masses, normal ROM, no bruits Chest: non tender, normal shape and expansion Heart: RRR, normal S1, S2, no murmurs Lungs: CTA bilaterally, no wheezes, rhonchi, or rales Abdomen: +bs, soft, non tender, non distended, no masses, no hepatomegaly, no splenomegaly, no bruits Back: non tender, normal ROM, no scoliosis Musculoskeletal: upper extremities non tender, no obvious deformity, normal ROM throughout, lower extremities non tender, no obvious deformity, normal ROM throughout Extremities: no edema, no cyanosis, no clubbing Pulses: 2+ symmetric, upper and lower extremities, normal cap refill Neurological: alert, oriented x 3, CN2-12 intact, strength normal upper extremities and lower extremities, sensation normal throughout, DTRs 2+ throughout, no cerebellar signs, gait normal Psychiatric: normal affect, behavior normal, pleasant  GU/rectal - deferred   Assessment and Plan :   Encounter Diagnoses  Name Primary?   Encounter for health maintenance examination in adult Yes   Extrinsic asthma without complication, unspecified asthma severity, unspecified whether persistent    Vitamin D deficiency    Vaccine counseling     Tobacco use    Screening for prostate cancer    Screening for lung cancer    Hyperlipidemia, unspecified hyperlipidemia type    History of sarcoidosis    Serum calcium elevated    Screening for hematuria or proteinuria    Skin lesion     This visit was a preventative care visit, also known as wellness visit or routine physical.   Topics typically include healthy lifestyle, diet, exercise, preventative care, vaccinations, sick and well care, proper use of emergency dept and after hours care, as well as other concerns.     Recommendations: Continue to return yearly for your annual wellness and preventative care visits.  This gives [place] a chance to discuss healthy lifestyle, exercise, vaccinations, review your chart record, and perform screenings where  appropriate.  I recommend you see your eye doctor yearly for routine vision care.  I recommend you see your dentist yearly for routine dental care including hygiene visits twice yearly.   Vaccination recommendations were reviewed Immunization History  Administered Date(s) Administered   [employer] Vaccination 03/06/2019, 09/06/2019, 10/04/2019   Pneumococcal Polysaccharide-23 10/10/2012, 11/09/2020   Tdap 11/07/2019    I recommend yearly flu shot, Shingrix shingles vaccine, Prevnar 20 pneumonia vaccine..  You declined vaccines today   Screening for cancer: Colon cancer screening: I reviewed your colonoscopy on file that is up to date from 2019 and 2024 colonoscopy, repeat in 7 years.  We discussed PSA, prostate exam, and prostate cancer screening risks/benefits.     Skin cancer screening: Check your skin regularly for new changes, growing lesions, or other lesions of concern Come in for evaluation if you have skin lesions of concern.  Lung cancer screening: Updated order for chest CT today  We currently don't have screenings for other cancers besides breast, cervical, colon, and lung cancers.  If you have a strong  family history of cancer or have other cancer screening concerns, please let me know.    Bone health: Get at least 150 minutes of aerobic exercise weekly Get weight bearing exercise at least once weekly Bone density test:  A bone density test is an imaging test that uses a type of X-ray to measure the amount of calcium and other minerals in your bones. The test may be used to diagnose or screen you for a condition that causes weak or thin bones (osteoporosis), predict your risk for a broken bone (fracture), or determine how well your osteoporosis treatment is working. The bone density test is recommended for females 65 and older, or females or males <65 if certain risk factors such as thyroid disease, long term use of steroids such as for asthma or rheumatological issues, vitamin D deficiency, estrogen deficiency, family history of osteoporosis, self or family history of fragility fracture in first degree relative.    Heart health: Get at least 150 minutes of aerobic exercise weekly Limit alcohol It is important to maintain a healthy blood pressure and healthy cholesterol numbers  Heart disease screening: Screening for heart disease includes screening for blood pressure, fasting lipids, glucose/diabetes screening, BMI height to weight ratio, reviewed of smoking status, physical activity, and diet.    Goals include blood pressure 120/80 or less, maintaining a healthy lipid/cholesterol profile, preventing diabetes or keeping diabetes numbers under good control, not smoking or using tobacco products, exercising most days per week or at least 150 minutes per week of exercise, and eating healthy variety of fruits and vegetables, healthy oils, and avoiding unhealthy food choices like fried food, fast food, high sugar and high cholesterol foods.    Other tests may possibly include EKG test, CT coronary calcium score, echocardiogram, exercise treadmill stress test.    Medical care options: I  recommend you continue to seek care here first for routine care.  We try really hard to have available appointments Monday through Friday daytime hours for sick visits, acute visits, and physicals.  Urgent care should be used for after hours and weekends for significant issues that cannot wait till the next day.  The emergency department should be used for significant potentially life-threatening emergencies.  The emergency department is expensive, can often have long wait times for less significant concerns, so try to utilize primary care, urgent care, or telemedicine when possible to avoid unnecessary trips to the emergency  department.  Virtual visits and telemedicine have been introduced since the pandemic started in 2020, and can be convenient ways to receive medical care.  We offer virtual appointments as well to assist you in a variety of options to seek medical care.    Separate significant issues discussed: History of sarcoidosis, continues to smoke  Tobacco use - advised cessation, counseled on quitting  Hyperlipidemia-continue current medication Lipitor 20mg  and Lopid 600mg  TID, updated labs today  Vitamin D deficiency-updated labs today, currently using 2000 vit OTC daily  Sinusitis and cough - begin round of amoxicillin, continue Mucinex DM OTC another 3-5 days, and can use Hycodan cough syrup for worse cough, caution on sedation  Skin lesion - I recommend you establish with a dermatologist for yearly surveillance.  He plans to see his family's dermatologist in [place].     Jackson Arnold was seen today for annual exam.  Diagnoses and all orders for this visit:  Encounter for health maintenance examination in adult -     Comprehensive metabolic panel -     CBC with Differential/Platelet -     PSA -     Lipid panel -     Parathyroid hormone, intact (no Ca) -     VITAMIN D 25 Hydroxy (Vit-D Deficiency, Fractures) -     CT CHEST LUNG CA SCREEN LOW DOSE W/O CM; Future -     POCT  Urinalysis DIP (Proadvantage Device)  Extrinsic asthma without complication, unspecified asthma severity, unspecified whether persistent  Vitamin D deficiency -     VITAMIN D 25 Hydroxy (Vit-D Deficiency, Fractures)  Vaccine counseling  Tobacco use  Screening for prostate cancer -     PSA  Screening for lung cancer -     CT CHEST LUNG CA SCREEN LOW DOSE W/O CM; Future  Hyperlipidemia, unspecified hyperlipidemia type -     Lipid panel  History of sarcoidosis  Serum calcium elevated -     Parathyroid hormone, intact (no Ca)  Screening for hematuria or proteinuria -     POCT Urinalysis DIP (Proadvantage Device)  Skin lesion  Other orders -     amoxicillin (AMOXIL) 875 MG tablet; Take 1 tablet (875 mg total) by mouth 2 (two) times daily for 10 days. -     HYDROcodone bit-homatropine (HYCODAN) 5-1.5 MG/5ML syrup; Take 5 mLs by mouth every 8 (eight) hours as needed for up to 5 days for cough.    Follow-up pending labs, yearly for physical

## 2023-05-03 ENCOUNTER — Other Ambulatory Visit: Payer: Self-pay | Admitting: Medical

## 2023-05-03 LAB — CBC WITH DIFFERENTIAL/PLATELET
Basophils Absolute: 0.1 10*3/uL (ref 0.0–0.2)
Basos: 1 %
EOS (ABSOLUTE): 0.2 10*3/uL (ref 0.0–0.4)
Eos: 2 %
Hematocrit: 46.7 % (ref 37.5–51.0)
Hemoglobin: 16.5 g/dL (ref 13.0–17.7)
Immature Grans (Abs): 0.1 10*3/uL (ref 0.0–0.1)
Immature Granulocytes: 1 %
Lymphocytes Absolute: 3.8 10*3/uL — ABNORMAL HIGH (ref 0.7–3.1)
Lymphs: 35 %
MCH: 30.1 pg (ref 26.6–33.0)
MCHC: 35.3 g/dL (ref 31.5–35.7)
MCV: 85 fL (ref 79–97)
Monocytes Absolute: 1 10*3/uL — ABNORMAL HIGH (ref 0.1–0.9)
Monocytes: 9 %
Neutrophils Absolute: 5.6 10*3/uL (ref 1.4–7.0)
Neutrophils: 52 %
Platelets: 342 10*3/uL (ref 150–450)
RBC: 5.48 x10E6/uL (ref 4.14–5.80)
RDW: 12.3 % (ref 11.6–15.4)
WBC: 10.7 10*3/uL (ref 3.4–10.8)

## 2023-05-03 LAB — COMPREHENSIVE METABOLIC PANEL
ALT: 18 IU/L (ref 0–44)
AST: 17 IU/L (ref 0–40)
Albumin: 4.6 g/dL (ref 3.8–4.9)
Alkaline Phosphatase: 88 IU/L (ref 44–121)
BUN/Creatinine Ratio: 12 (ref 9–20)
BUN: 11 mg/dL (ref 6–24)
Bilirubin Total: 0.4 mg/dL (ref 0.0–1.2)
CO2: 22 mmol/L (ref 20–29)
Calcium: 9.7 mg/dL (ref 8.7–10.2)
Chloride: 106 mmol/L (ref 96–106)
Creatinine, Ser: 0.93 mg/dL (ref 0.76–1.27)
Globulin, Total: 2.2 g/dL (ref 1.5–4.5)
Glucose: 82 mg/dL (ref 70–99)
Potassium: 4 mmol/L (ref 3.5–5.2)
Sodium: 142 mmol/L (ref 134–144)
Total Protein: 6.8 g/dL (ref 6.0–8.5)
eGFR: 96 mL/min/{1.73_m2} (ref >59)

## 2023-05-03 LAB — LIPID PANEL
Cholesterol, Total: 164 mg/dL (ref 100–199)
HDL: 31 mg/dL — ABNORMAL LOW (ref >39)
LDL CALC COMMENT:: 5.3 ratio — ABNORMAL HIGH (ref 0.0–5.0)
LDL Chol Calc (NIH): 115 mg/dL — ABNORMAL HIGH (ref 0–99)
Triglycerides: 94 mg/dL (ref 0–149)
VLDL Cholesterol Cal: 18 mg/dL (ref 5–40)

## 2023-05-03 LAB — PSA: Prostate Specific Ag, Serum: 1 ng/mL (ref 0.0–4.0)

## 2023-05-03 LAB — VITAMIN D 25 HYDROXY (VIT D DEFICIENCY, FRACTURES): Vit D, 25-Hydroxy: 38.6 ng/mL (ref 30.0–100.0)

## 2023-05-03 LAB — PARATHYROID HORMONE, INTACT (NO CA): PTH: 26 pg/mL (ref 15–65)

## 2023-05-03 MED ORDER — VITAMIN D 50 MCG (2000 UT) PO CAPS: 1.0000 | ORAL_CAPSULE | Freq: Every day | ORAL | 2 refills | Status: AC

## 2023-05-03 MED ORDER — FENOFIBRATE 145 MG PO TABS: 145.0000 mg | ORAL_TABLET | Freq: Every day | ORAL | 2 refills | Status: DC

## 2023-05-03 MED ORDER — ATORVASTATIN CALCIUM 20 MG PO TABS: 20.0000 mg | ORAL_TABLET | Freq: Every day | ORAL | 2 refills | Status: AC

## 2023-05-03 NOTE — Progress Notes (Signed)
 Results sent through MyChart

## 2023-05-24 ENCOUNTER — Ambulatory Visit
Admission: RE | Admit: 2023-05-24 | Discharge: 2023-05-24 | Disposition: A | Discharge status: Home / Self Care | Source: Ambulatory Visit | Attending: Medical | Admitting: Medical

## 2023-05-24 DIAGNOSIS — F17210 Nicotine dependence, cigarettes, uncomplicated: Secondary | ICD-10-CM | POA: Diagnosis not present

## 2023-05-24 DIAGNOSIS — Z0000 Encounter for general adult medical examination without abnormal findings: Secondary | ICD-10-CM

## 2023-05-24 DIAGNOSIS — Z122 Encounter for screening for malignant neoplasm of respiratory organs: Secondary | ICD-10-CM | POA: Diagnosis not present

## 2023-06-26 NOTE — Progress Notes (Signed)
 Results sent through MyChart

## 2023-07-05 ENCOUNTER — Other Ambulatory Visit: Payer: Self-pay | Admitting: Medical

## 2023-08-04 ENCOUNTER — Other Ambulatory Visit: Payer: Self-pay | Admitting: Medical

## 2023-10-24 ENCOUNTER — Encounter: Payer: Self-pay | Admitting: Medical

## 2023-10-24 ENCOUNTER — Ambulatory Visit (INDEPENDENT_AMBULATORY_CARE_PROVIDER_SITE_OTHER): Admitting: Medical

## 2023-10-24 VITALS — BP 122/72 | HR 68 | Ht 71.0 in | Wt 178.0 lb

## 2023-10-24 DIAGNOSIS — R82998 Other abnormal findings in urine: Secondary | ICD-10-CM | POA: Diagnosis not present

## 2023-10-24 DIAGNOSIS — E559 Vitamin D deficiency, unspecified: Secondary | ICD-10-CM | POA: Diagnosis not present

## 2023-10-24 DIAGNOSIS — Z862 Personal history of diseases of the blood and blood-forming organs and certain disorders involving the immune mechanism: Secondary | ICD-10-CM | POA: Diagnosis not present

## 2023-10-24 DIAGNOSIS — F17200 Nicotine dependence, unspecified, uncomplicated: Secondary | ICD-10-CM

## 2023-10-24 DIAGNOSIS — E785 Hyperlipidemia, unspecified: Secondary | ICD-10-CM

## 2023-10-24 LAB — POCT URINALYSIS DIP (PROADVANTAGE DEVICE)
Blood, UA: NEGATIVE
Glucose, UA: NEGATIVE mg/dL
Ketones, POC UA: NEGATIVE mg/dL
Nitrite, UA: NEGATIVE
Protein Ur, POC: NEGATIVE mg/dL
Specific Gravity, Urine: 1.005
Urobilinogen, Ur: 0.2
pH, UA: 7.5 (ref 5.0–8.0)

## 2023-10-24 NOTE — Progress Notes (Signed)
 Name: Jackson Arnold   Date of Visit: 10/24/23   Date of last visit with me: 08/04/2023   CHIEF COMPLAINT:  Chief Complaint  Patient presents with   Consult    Taking atorvastatin  and fenofibrate . Has has some concerns about liver/kidneys and is here to discuss.        HPI:  Discussed the use of AI scribe software for clinical note transcription with the patient, who gave verbal consent to proceed.  History of Present Illness   Jackson Arnold is a 58 year old male who presents with concerns about abnormal urine test results and fatigue.  He reports a recent urine test showed a high creatinine level, though he is unsure of the exact value. His urine is sometimes dark brown in color.  He experiences unusual fatigue, stating he could 'sleep all day' despite working a lot. He works third shift and takes his medications in the morning after work.  He is currently taking atorvastatin  and fenofibrate  every morning, along with a vitamin D  supplement. He has a history of high triglycerides and low HDL, for which he is on these medications. Previous blood work in March showed normal liver, kidney, and electrolyte levels, and his hemoglobin was slightly elevated. His vitamin D  levels have been low in the past but were improved at the last check.  He smokes a pack of cigarettes a day and does not consume alcohol. His water intake is better than normal, drinking a lot of vitamin water and Gatorade. He follows a healthy diet, including foods like salmon, guacamole, fruits, vegetables, and whole grains.  In the past, a scan showed some cholesterol in his coronary arteries, which is why he is on a statin. He has attempted to quit smoking using Wellbutrin  and was previously prescribed Chantix , which he could not fill as it was taken off the market.  No other aggravating or relieving factors. No other complaint.   Past Medical History:  Diagnosis Date   Bronchitis    Chest pain 06/2011    ED/observation visit   H/O CT scan 06/2011   cardiac, normal   History of EKG 5/13   sinus bradycardia   Hyperlipidemia    tricor  prior   Pulmonary sarcoidosis (HCC) 1998   Tobacco use    Vitamin D  deficiency    Current Outpatient Medications on File Prior to Visit  Medication Sig Dispense Refill   atorvastatin  (LIPITOR) 20 MG tablet Take 1 tablet (20 mg total) by mouth daily. 90 tablet 2   Cholecalciferol (VITAMIN D ) 50 MCG (2000 UT) CAPS Take 1 capsule (2,000 Units total) by mouth daily. 90 capsule 2   fenofibrate  (TRICOR ) 145 MG tablet TAKE 1 TABLET BY MOUTH EVERY DAY 90 tablet 1   hydrocortisone  2.5 % cream Apply topically 2 (two) times daily. (Patient not taking: Reported on 10/24/2023) 30 g 2   No current facility-administered medications on file prior to visit.     Objective: BP 122/72   Pulse 68   Ht 5' 11 (1.803 m)   Wt 178 lb (80.7 kg)   BMI 24.83 kg/m   General appearence: alert, no distress, WD/WN,  Neck: supple, no lymphadenopathy, no thyromegaly, no masses Heart: RRR, normal S1, S2, no murmurs Lungs: CTA bilaterally, no wheezes, rhonchi, or rales Abdomen: +bs, soft, non tender, non distended, no masses, no hepatomegaly, no splenomegaly Pulses: 2+ symmetric, upper and lower extremities, normal cap refill   Assessment: Encounter Diagnoses  Name Primary?   Hyperlipidemia,  unspecified hyperlipidemia type Yes   History of sarcoidosis    Vitamin D  deficiency    Dark urine    Smoker     Plan: Abnormal urine findings with elevated creatinine and bilirubin Intermittent dark brown urine with elevated creatinine and bilirubin. Etiology unclear; differential includes kidney dysfunction or dehydration. Atorvastatin  and fenofibrate  could cause liver inflammation. - Order comprehensive metabolic panel. - Order urine analysis. - Consider abdominal or kidney imaging if significant abnormalities found.  Fatigue Persistent fatigue possibly related to work schedule,  medication, or underlying conditions. - Evaluate potential causes of fatigue.  Hyperlipidemia on statin and fenofibrate  therapy Hyperlipidemia with high triglycerides, low HDL, slightly elevated LDL. Managed with atorvastatin  and fenofibrate . Lifestyle modifications recommended. - Order lipid panel. - Encourage vigorous exercise and diet rich in healthy fats.  Tobacco use disorder Chronic tobacco use, smoking one pack per day. Previous attempts to quit with Wellbutrin . Smoking cessation critical. - Discuss smoking cessation strategies, including nicotine patches or Wellbutrin . - Encourage gradual reduction in tobacco use.   Jackson Arnold was seen today for consult.  Diagnoses and all orders for this visit:  Hyperlipidemia, unspecified hyperlipidemia type -     Comprehensive metabolic panel with GFR -     Lipid panel  History of sarcoidosis  Vitamin D  deficiency  Dark urine -     Lipid panel -     POCT Urinalysis DIP (Proadvantage Device)  Smoker   F/u pending labs

## 2023-10-25 ENCOUNTER — Other Ambulatory Visit: Payer: Self-pay | Admitting: Medical

## 2023-10-25 ENCOUNTER — Ambulatory Visit: Payer: Self-pay | Admitting: Medical

## 2023-10-25 LAB — COMPREHENSIVE METABOLIC PANEL WITH GFR
ALT: 30 IU/L (ref 0–44)
AST: 20 IU/L (ref 0–40)
Albumin: 4.6 g/dL (ref 3.8–4.9)
Alkaline Phosphatase: 71 IU/L (ref 44–121)
BUN/Creatinine Ratio: 10 (ref 9–20)
BUN: 10 mg/dL (ref 6–24)
Bilirubin Total: 0.4 mg/dL (ref 0.0–1.2)
CO2: 23 mmol/L (ref 20–29)
Calcium: 10.2 mg/dL (ref 8.7–10.2)
Chloride: 104 mmol/L (ref 96–106)
Creatinine, Ser: 1.04 mg/dL (ref 0.76–1.27)
Globulin, Total: 2.6 g/dL (ref 1.5–4.5)
Glucose: 100 mg/dL — ABNORMAL HIGH (ref 70–99)
Potassium: 4.5 mmol/L (ref 3.5–5.2)
Sodium: 140 mmol/L (ref 134–144)
Total Protein: 7.2 g/dL (ref 6.0–8.5)
eGFR: 84 mL/min/1.73 (ref >59)

## 2023-10-25 LAB — LIPID PANEL
Chol/HDL Ratio: 4.8 ratio (ref 0.0–5.0)
Cholesterol, Total: 167 mg/dL (ref 100–199)
HDL: 35 mg/dL — ABNORMAL LOW (ref >39)
LDL Chol Calc (NIH): 116 mg/dL — ABNORMAL HIGH (ref 0–99)
Triglycerides: 85 mg/dL (ref 0–149)
VLDL Cholesterol Cal: 16 mg/dL (ref 5–40)

## 2023-10-25 NOTE — Progress Notes (Signed)
 Results through MyChart

## 2024-01-30 ENCOUNTER — Other Ambulatory Visit: Payer: Self-pay | Admitting: Medical

## 2024-01-30 NOTE — Telephone Encounter (Signed)
Pt has an appt in March 

## 2024-03-22 ENCOUNTER — Other Ambulatory Visit: Payer: Self-pay | Admitting: Medical Genetics

## 2024-04-19 ENCOUNTER — Other Ambulatory Visit (HOSPITAL_COMMUNITY)

## 2024-05-09 ENCOUNTER — Encounter: Payer: Self-pay | Admitting: Medical
